# Patient Record
Sex: Male | Born: 1955 | Race: Black or African American | Hispanic: No | Marital: Single | Smoking: Current every day smoker
Health system: Southern US, Community
[De-identification: ages and names within clinical notes are randomized; demographics above are authoritative.]

## PROBLEM LIST (undated history)

## (undated) DIAGNOSIS — I10 Essential (primary) hypertension: Secondary | ICD-10-CM

## (undated) HISTORY — PX: NECK SURGERY: SHX720

---

## 2002-07-15 ENCOUNTER — Emergency Department (HOSPITAL_COMMUNITY): Admission: EM | Admit: 2002-07-15 | Discharge: 2002-07-15 | Payer: Self-pay | Admitting: *Deleted

## 2002-07-15 ENCOUNTER — Encounter: Payer: Self-pay | Admitting: *Deleted

## 2005-10-17 ENCOUNTER — Emergency Department (HOSPITAL_COMMUNITY): Admission: EM | Admit: 2005-10-17 | Discharge: 2005-10-17 | Payer: Self-pay | Admitting: Emergency Medicine

## 2008-01-06 ENCOUNTER — Emergency Department (HOSPITAL_COMMUNITY): Admission: EM | Admit: 2008-01-06 | Discharge: 2008-01-06 | Payer: Self-pay | Admitting: Emergency Medicine

## 2008-06-18 ENCOUNTER — Emergency Department (HOSPITAL_COMMUNITY): Admission: EM | Admit: 2008-06-18 | Discharge: 2008-06-18 | Payer: Self-pay | Admitting: Emergency Medicine

## 2008-12-14 ENCOUNTER — Emergency Department (HOSPITAL_COMMUNITY): Admission: EM | Admit: 2008-12-14 | Discharge: 2008-12-14 | Payer: Self-pay | Admitting: Emergency Medicine

## 2009-07-08 ENCOUNTER — Emergency Department (HOSPITAL_COMMUNITY): Admission: EM | Admit: 2009-07-08 | Discharge: 2009-07-08 | Payer: Self-pay | Admitting: Emergency Medicine

## 2009-12-26 ENCOUNTER — Emergency Department (HOSPITAL_COMMUNITY)
Admission: EM | Admit: 2009-12-26 | Discharge: 2009-12-26 | Payer: Self-pay | Source: Home / Self Care | Admitting: Emergency Medicine

## 2010-07-11 LAB — POCT CARDIAC MARKERS
CKMB, poc: 1.1 ng/mL (ref 1.0–8.0)
Myoglobin, poc: 33.4 ng/mL (ref 12–200)

## 2010-07-11 LAB — DIFFERENTIAL
Band Neutrophils: 0 % (ref 0–10)
Basophils Absolute: 0 10*3/uL (ref 0.0–0.1)
Basophils Relative: 0 % (ref 0–1)
Eosinophils Absolute: 0.3 10*3/uL (ref 0.0–0.7)
Eosinophils Relative: 5 % (ref 0–5)
Metamyelocytes Relative: 0 %
Monocytes Absolute: 0.1 10*3/uL (ref 0.1–1.0)
Monocytes Relative: 1 % — ABNORMAL LOW (ref 3–12)
Myelocytes: 0 %
nRBC: 0 /100 WBC

## 2010-07-11 LAB — COMPREHENSIVE METABOLIC PANEL
ALT: 27 U/L (ref 0–53)
AST: 51 U/L — ABNORMAL HIGH (ref 0–37)
Albumin: 3.8 g/dL (ref 3.5–5.2)
CO2: 26 mEq/L (ref 19–32)
Calcium: 8.8 mg/dL (ref 8.4–10.5)
Creatinine, Ser: 0.74 mg/dL (ref 0.4–1.5)
GFR calc Af Amer: >60 mL/min (ref >60)
GFR calc non Af Amer: >60 mL/min (ref >60)
Sodium: 141 mEq/L (ref 135–145)
Total Protein: 7 g/dL (ref 6.0–8.3)

## 2010-07-11 LAB — CBC
MCHC: 33.3 g/dL (ref 30.0–36.0)
MCV: 81.7 fL (ref 78.0–100.0)
Platelets: 217 10*3/uL (ref 150–400)
RBC: 4.61 MIL/uL (ref 4.22–5.81)
RDW: 13.9 % (ref 11.5–15.5)

## 2011-01-06 LAB — DIFFERENTIAL
Band Neutrophils: 0
Blasts: 0
Metamyelocytes Relative: 0
Promyelocytes Absolute: 0
nRBC: 0

## 2011-01-06 LAB — BASIC METABOLIC PANEL
BUN: 5 — ABNORMAL LOW
CO2: 24
Calcium: 9.1
Glucose, Bld: 104 — ABNORMAL HIGH
Sodium: 138

## 2011-01-06 LAB — POCT CARDIAC MARKERS
CKMB, poc: 2.4
Myoglobin, poc: 49.1
Troponin i, poc: <0.05

## 2011-01-06 LAB — CBC
HCT: 37.7 — ABNORMAL LOW
Hemoglobin: 12.5 — ABNORMAL LOW
MCHC: 33.1
Platelets: 248
RDW: 13.4

## 2012-01-01 ENCOUNTER — Encounter (HOSPITAL_COMMUNITY): Payer: Self-pay | Admitting: Emergency Medicine

## 2012-01-01 ENCOUNTER — Emergency Department (HOSPITAL_COMMUNITY)
Admission: EM | Admit: 2012-01-01 | Discharge: 2012-01-01 | Disposition: A | Discharge status: Home / Self Care | Payer: Self-pay | Attending: Emergency Medicine | Admitting: Emergency Medicine

## 2012-01-01 DIAGNOSIS — M545 Low back pain, unspecified: Secondary | ICD-10-CM | POA: Insufficient documentation

## 2012-01-01 DIAGNOSIS — M549 Dorsalgia, unspecified: Secondary | ICD-10-CM

## 2012-01-01 DIAGNOSIS — F17200 Nicotine dependence, unspecified, uncomplicated: Secondary | ICD-10-CM | POA: Insufficient documentation

## 2012-01-01 NOTE — ED Notes (Signed)
Patient states was lifting heavy objects yesterday; presents today with left hip pain. States feels like he pulled a muscle.

## 2012-01-01 NOTE — ED Provider Notes (Signed)
History     CSN: [id number]  Arrival date & time 01/01/12  4098   First MD Initiated Contact with Patient 01/01/12 [phone]      Chief Complaint  Patient presents with  . Hip Pain     Patient is a 56 y.o. male presenting with hip pain. The history is provided by the patient.  Hip Pain This is a new problem. The problem occurs constantly. The problem has not changed since onset.Pertinent negatives include no abdominal pain and no shortness of breath. The symptoms are aggravated by walking. The symptoms are relieved by rest. Treatments tried: ibuprofen. The treatment provided mild relief.  pt reports he was doing heavy lifting yesterday and felt like he "pulled a muscle" in his left hip.  No falls.  No difficulty walking.  No weakness in his legs.  No urinary incontinence reported.  He feels improved with rest and ibuprofen.   No abdominal pain is reported  PMH - none  History reviewed. No pertinent past surgical history.  No family history on file.  History  Substance Use Topics  . Smoking status: Current Every Day Smoker  . Smokeless tobacco: Not on file  . Alcohol Use: Yes     occ      Review of Systems  Respiratory: Negative for shortness of breath.   Gastrointestinal: Negative for abdominal pain.    Allergies  Review of patient's allergies indicates no known allergies.  Home Medications  No current outpatient prescriptions on file.  BP 160/80  Pulse 59  Temp 97.9 F (36.6 C) (Oral)  Resp 18  Ht 5\' 7"  (1.702 m)  Wt 150 lb (68.04 kg)  BMI 23.49 kg/m2  SpO2 99%  Physical Exam CONSTITUTIONAL: Well developed/well nourished HEAD AND FACE: Normocephalic/atraumatic EYES: EOMI/PERRL ENMT: Mucous membranes moist NECK: supple no meningeal signs SPINE:entire spine nontender Lumbar paraspinal tenderness noted.  No bruising/crepitance/stepoffs noted to spine CV: S1/S2 noted, no murmurs/rubs/gallops noted LUNGS: Lungs are clear to auscultation bilaterally, no  apparent distress ABDOMEN: soft, nontender, no rebound or guarding NEURO: Pt is awake/alert, moves all extremitiesx4.  Gait normal.    equal distal motor: hip flexion/knee flexion/extension, ankle dorsi/plantar flexion, no apparent sensory deficit in any dermatome. Pt is able to ambulate unassisted. EXTREMITIES:  full ROM.  No tenderness with ROM of left hip SKIN: warm, color normal PSYCH: no abnormalities of mood noted  ED Course  Procedures    1. Back pain       MDM  Nursing notes including past medical history and social history reviewed and considered in documentation  Pt advised to limit heavy lifting for one week, continue ibuprofen and return for worsened pain or any new weakness in his legs over the next 24-48 hours        Joya Gaskins, MD 01/01/12 [phone]

## 2012-01-27 ENCOUNTER — Other Ambulatory Visit: Payer: Self-pay | Admitting: *Deleted

## 2014-02-14 ENCOUNTER — Encounter (HOSPITAL_COMMUNITY): Payer: Self-pay | Admitting: Cardiology

## 2014-02-14 ENCOUNTER — Emergency Department (HOSPITAL_COMMUNITY)
Admission: EM | Admit: 2014-02-14 | Discharge: 2014-02-14 | Disposition: A | Discharge status: Home / Self Care | Payer: Self-pay | Attending: Emergency Medicine | Admitting: Emergency Medicine

## 2014-02-14 ENCOUNTER — Emergency Department (HOSPITAL_COMMUNITY): Payer: Self-pay

## 2014-02-14 DIAGNOSIS — Z720 Tobacco use: Secondary | ICD-10-CM | POA: Insufficient documentation

## 2014-02-14 DIAGNOSIS — I10 Essential (primary) hypertension: Secondary | ICD-10-CM | POA: Insufficient documentation

## 2014-02-14 DIAGNOSIS — R410 Disorientation, unspecified: Secondary | ICD-10-CM | POA: Insufficient documentation

## 2014-02-14 HISTORY — DX: Essential (primary) hypertension: I10

## 2014-02-14 LAB — RAPID URINE DRUG SCREEN, HOSP PERFORMED
AMPHETAMINES: NOT DETECTED
Barbiturates: NOT DETECTED
Benzodiazepines: POSITIVE — AB
Cocaine: NOT DETECTED
OPIATES: NOT DETECTED
Tetrahydrocannabinol: NOT DETECTED

## 2014-02-14 LAB — APTT: aPTT: 24 seconds (ref 24–37)

## 2014-02-14 LAB — BASIC METABOLIC PANEL
ANION GAP: 13 (ref 5–15)
BUN: 13 mg/dL (ref 6–23)
CALCIUM: 10.4 mg/dL (ref 8.4–10.5)
CO2: 25 mEq/L (ref 19–32)
CREATININE: 0.86 mg/dL (ref 0.50–1.35)
Chloride: 100 mEq/L (ref 96–112)
GFR calc non Af Amer: >90 mL/min (ref >90)
Glucose, Bld: 101 mg/dL — ABNORMAL HIGH (ref 70–99)
Potassium: 3.9 mEq/L (ref 3.7–5.3)
Sodium: 138 mEq/L (ref 137–147)

## 2014-02-14 LAB — CBC WITH DIFFERENTIAL/PLATELET
BASOS ABS: 0 10*3/uL (ref 0.0–0.1)
BASOS PCT: 0 % (ref 0–1)
EOS PCT: 1 % (ref 0–5)
Eosinophils Absolute: 0 10*3/uL (ref 0.0–0.7)
HEMATOCRIT: 37.3 % — AB (ref 39.0–52.0)
HEMOGLOBIN: 12.4 g/dL — AB (ref 13.0–17.0)
Lymphocytes Relative: 17 % (ref 12–46)
Lymphs Abs: 1.1 10*3/uL (ref 0.7–4.0)
MCH: 26.8 pg (ref 26.0–34.0)
MCHC: 33.2 g/dL (ref 30.0–36.0)
MCV: 80.6 fL (ref 78.0–100.0)
MONO ABS: 0.9 10*3/uL (ref 0.1–1.0)
MONOS PCT: 13 % — AB (ref 3–12)
Neutro Abs: 4.4 10*3/uL (ref 1.7–7.7)
Neutrophils Relative %: 69 % (ref 43–77)
Platelets: 160 10*3/uL (ref 150–400)
RBC: 4.63 MIL/uL (ref 4.22–5.81)
RDW: 14.1 % (ref 11.5–15.5)
WBC: 6.4 10*3/uL (ref 4.0–10.5)

## 2014-02-14 LAB — URINALYSIS, ROUTINE W REFLEX MICROSCOPIC
BILIRUBIN URINE: NEGATIVE
GLUCOSE, UA: NEGATIVE mg/dL
Hgb urine dipstick: NEGATIVE
KETONES UR: NEGATIVE mg/dL
LEUKOCYTES UA: NEGATIVE
NITRITE: NEGATIVE
PH: 6 (ref 5.0–8.0)
Protein, ur: NEGATIVE mg/dL
SPECIFIC GRAVITY, URINE: 1.02 (ref 1.005–1.030)
Urobilinogen, UA: 1 mg/dL (ref 0.0–1.0)

## 2014-02-14 LAB — PROTIME-INR
INR: 1.02 (ref 0.00–1.49)
Prothrombin Time: 13.5 seconds (ref 11.6–15.2)

## 2014-02-14 LAB — ETHANOL

## 2014-02-14 LAB — AMMONIA: Ammonia: 39 umol/L (ref 11–60)

## 2014-02-14 MED ORDER — SODIUM CHLORIDE 0.9 % IV SOLN
1000.0000 mL | INTRAVENOUS | Status: DC
  Administered 2014-02-14: 1000 mL via INTRAVENOUS

## 2014-02-14 NOTE — ED Notes (Addendum)
[facility]  Per [facility] jail,  Pt has been hallucinating, aggitated  and has been confused.  Has been at the jail since 10/7.  Pt is a heavy, daily drinker. Staff nurse gave Librium,  norvasc , hctz and Mag ox at the jail this morning.   Pt states he is here for his blood pressure being high.  States he only drinks every now and then.

## 2014-02-14 NOTE — Discharge Instructions (Signed)
Confusion Confusion is the inability to think with your usual speed or clarity. Confusion may come on quickly or slowly over time. How quickly the confusion comes on depends on the cause. Confusion can be due to any number of causes. CAUSES   Concussion, head injury, or head trauma.  Seizures.  Stroke.  Fever.  Brain tumor.  Age related decreased brain function (dementia).  Heightened emotional states like rage or terror.  Mental illness in which the person loses the ability to determine what is real and what is not (hallucinations).  Infections such as a urinary tract infection (UTI).  Toxic effects from alcohol, drugs, or prescription medicines.  Dehydration and an imbalance of salts in the body (electrolytes).  Lack of sleep.  Low blood sugar (diabetes).  Low levels of oxygen from conditions such as chronic lung disorders.  Drug interactions or other medicine side effects.  Nutritional deficiencies, especially niacin, thiamine, vitamin C, or vitamin B.  Sudden drop in body temperature (hypothermia).  Change in routine, such as when traveling or hospitalized. SIGNS AND SYMPTOMS  People often describe their thinking as cloudy or unclear when they are confused. Confusion can also include feeling disoriented. That means you are unaware of where or who you are. You may also not know what the date or time is. If confused, you may also have difficulty paying attention, remembering, and making decisions. Some people also act aggressively when they are confused.  DIAGNOSIS  The medical evaluation of confusion may include:  Blood and urine tests.  X-rays.  Brain and nervous system tests.  Analyzing your brain waves (electroencephalogram or EEG).  Magnetic resonance imaging (MRI) of your head.  Computed tomography (CT) scan of your head.  Mental status tests in which your health care provider may ask many questions. Some of these questions may seem silly or strange,  but they are a very important test to help diagnose and treat confusion. TREATMENT  An admission to the hospital may not be needed, but a person with confusion should not be left alone. Stay with a family member or friend until the confusion clears. Avoid alcohol, pain relievers, or sedative drugs until you have fully recovered. Do not drive until directed by your health care provider. HOME CARE INSTRUCTIONS  What family and friends can do:  To find out if someone is confused, ask the person to state his or her name, age, and the date. If the person is unsure or answers incorrectly, he or she is confused.  Always introduce yourself, no matter how well the person knows you.  Often remind the person of his or her location.  Place a calendar and clock near the confused person.  Help the person with his or her medicines. You may want to use a pill box, an alarm as a reminder, or give the person each dose as prescribed.  Talk about current events and plans for the day.  Try to keep the environment calm, quiet, and peaceful.  Make sure the person keeps follow-up visits with his or her health care provider. PREVENTION  Ways to prevent confusion:  Avoid alcohol.  Eat a balanced diet.  Get enough sleep.  Take medicine only as directed by your health care provider.  Do not become isolated. Spend time with other people and make plans for your days.  Keep careful watch on your blood sugar levels if you are diabetic. SEEK IMMEDIATE MEDICAL CARE IF:   You develop severe headaches, repeated vomiting, seizures, blackouts, or   slurred speech.  There is increasing confusion, weakness, numbness, restlessness, or personality changes.  You develop a loss of balance, have marked dizziness, feel uncoordinated, or fall.  You have delusions, hallucinations, or develop severe anxiety.  Your family members think you need to be rechecked. Document Released: 04/30/2004 Document Revised: 08/07/2013  Document Reviewed: 04/28/2013 [employer] Patient Information 2015 [employer]. This information is not intended to replace advice given to you by your health care provider. Make sure you discuss any questions you have with your health care provider.  
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
## 2015-11-10 ENCOUNTER — Emergency Department (HOSPITAL_COMMUNITY)
Admission: EM | Admit: 2015-11-10 | Discharge: 2015-11-10 | Disposition: A | Discharge status: Home / Self Care | Payer: No Typology Code available for payment source | Attending: Emergency Medicine | Admitting: Emergency Medicine

## 2015-11-10 ENCOUNTER — Encounter (HOSPITAL_COMMUNITY): Payer: Self-pay

## 2015-11-10 ENCOUNTER — Emergency Department (HOSPITAL_COMMUNITY): Payer: No Typology Code available for payment source

## 2015-11-10 DIAGNOSIS — S39012A Strain of muscle, fascia and tendon of lower back, initial encounter: Secondary | ICD-10-CM | POA: Diagnosis not present

## 2015-11-10 DIAGNOSIS — R0781 Pleurodynia: Secondary | ICD-10-CM | POA: Diagnosis not present

## 2015-11-10 DIAGNOSIS — Y939 Activity, unspecified: Secondary | ICD-10-CM | POA: Diagnosis not present

## 2015-11-10 DIAGNOSIS — F17200 Nicotine dependence, unspecified, uncomplicated: Secondary | ICD-10-CM | POA: Diagnosis not present

## 2015-11-10 DIAGNOSIS — R51 Headache: Secondary | ICD-10-CM | POA: Diagnosis not present

## 2015-11-10 DIAGNOSIS — Y92410 Unspecified street and highway as the place of occurrence of the external cause: Secondary | ICD-10-CM | POA: Insufficient documentation

## 2015-11-10 DIAGNOSIS — Y999 Unspecified external cause status: Secondary | ICD-10-CM | POA: Diagnosis not present

## 2015-11-10 DIAGNOSIS — S199XXA Unspecified injury of neck, initial encounter: Secondary | ICD-10-CM | POA: Diagnosis present

## 2015-11-10 DIAGNOSIS — S161XXA Strain of muscle, fascia and tendon at neck level, initial encounter: Secondary | ICD-10-CM | POA: Diagnosis not present

## 2015-11-10 DIAGNOSIS — I10 Essential (primary) hypertension: Secondary | ICD-10-CM | POA: Diagnosis not present

## 2015-11-10 MED ORDER — CYCLOBENZAPRINE HCL 10 MG PO TABS: 10.0000 mg | ORAL_TABLET | Freq: Three times a day (TID) | ORAL | 0 refills | Status: DC | PRN

## 2015-11-10 MED ORDER — IBUPROFEN 800 MG PO TABS: 800.0000 mg | ORAL_TABLET | Freq: Three times a day (TID) | ORAL | 0 refills | Status: DC
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
## 2015-11-10 NOTE — Discharge Instructions (Signed)
Apply ice packs on/off to your back and neck.  Follow-up with the clinic listed or return to ER

## 2015-11-10 NOTE — ED Notes (Signed)
Patient transported to [facility].

## 2019-02-09 ENCOUNTER — Emergency Department (HOSPITAL_COMMUNITY)
Admission: EM | Admit: 2019-02-09 | Discharge: 2019-02-09 | Disposition: A | Discharge status: Home / Self Care | Payer: Self-pay | Attending: Emergency Medicine | Admitting: Emergency Medicine

## 2019-02-09 ENCOUNTER — Encounter (HOSPITAL_COMMUNITY): Payer: Self-pay | Admitting: Emergency Medicine

## 2019-02-09 ENCOUNTER — Other Ambulatory Visit: Payer: Self-pay

## 2019-02-09 DIAGNOSIS — R0789 Other chest pain: Secondary | ICD-10-CM | POA: Insufficient documentation

## 2019-02-09 DIAGNOSIS — M25562 Pain in left knee: Secondary | ICD-10-CM | POA: Insufficient documentation

## 2019-02-09 DIAGNOSIS — Z5321 Procedure and treatment not carried out due to patient leaving prior to being seen by health care provider: Secondary | ICD-10-CM | POA: Insufficient documentation

## 2019-02-09 NOTE — ED Triage Notes (Signed)
Pt states that his knees are hurting him he denies injury he states that he fell hurting his ribs on the right side about 2 weeks ago

## 2019-05-27 ENCOUNTER — Encounter (HOSPITAL_COMMUNITY): Payer: Self-pay | Admitting: Emergency Medicine

## 2019-05-27 ENCOUNTER — Other Ambulatory Visit: Payer: Self-pay

## 2019-05-27 ENCOUNTER — Emergency Department (HOSPITAL_COMMUNITY)
Admission: EM | Admit: 2019-05-27 | Discharge: 2019-05-27 | Disposition: A | Discharge status: Home / Self Care | Payer: Medicaid Other | Attending: Emergency Medicine | Admitting: Emergency Medicine

## 2019-05-27 ENCOUNTER — Emergency Department (HOSPITAL_COMMUNITY): Payer: Medicaid Other

## 2019-05-27 DIAGNOSIS — R0789 Other chest pain: Secondary | ICD-10-CM | POA: Insufficient documentation

## 2019-05-27 DIAGNOSIS — I10 Essential (primary) hypertension: Secondary | ICD-10-CM | POA: Insufficient documentation

## 2019-05-27 DIAGNOSIS — F17210 Nicotine dependence, cigarettes, uncomplicated: Secondary | ICD-10-CM | POA: Diagnosis not present

## 2019-05-27 LAB — BASIC METABOLIC PANEL
Anion gap: 11 (ref 5–15)
BUN: 8 mg/dL (ref 8–23)
CO2: 23 mmol/L (ref 22–32)
Calcium: 8.8 mg/dL — ABNORMAL LOW (ref 8.9–10.3)
Chloride: 105 mmol/L (ref 98–111)
Creatinine, Ser: 0.75 mg/dL (ref 0.61–1.24)
GFR calc Af Amer: >60 mL/min (ref >60)
GFR calc non Af Amer: >60 mL/min (ref >60)
Glucose, Bld: 90 mg/dL (ref 70–99)
Potassium: 3.4 mmol/L — ABNORMAL LOW (ref 3.5–5.1)
Sodium: 139 mmol/L (ref 135–145)

## 2019-05-27 LAB — TROPONIN I (HIGH SENSITIVITY)
Troponin I (High Sensitivity): 30 ng/L — ABNORMAL HIGH (ref <18)
Troponin I (High Sensitivity): 26 ng/L — ABNORMAL HIGH (ref <18)

## 2019-05-27 LAB — CBC
HCT: 40.4 % (ref 39.0–52.0)
Hemoglobin: 12.6 g/dL — ABNORMAL LOW (ref 13.0–17.0)
MCH: 25.7 pg — ABNORMAL LOW (ref 26.0–34.0)
MCHC: 31.2 g/dL (ref 30.0–36.0)
MCV: 82.4 fL (ref 80.0–100.0)
Platelets: 344 10*3/uL (ref 150–400)
RBC: 4.9 MIL/uL (ref 4.22–5.81)
RDW: 15.1 % (ref 11.5–15.5)
WBC: 7.5 10*3/uL (ref 4.0–10.5)
nRBC: 0 % (ref 0.0–0.2)

## 2019-05-27 MED ORDER — TRAMADOL HCL 50 MG PO TABS: 50.0000 mg | ORAL_TABLET | Freq: Four times a day (QID) | ORAL | 0 refills | Status: DC | PRN

## 2019-05-27 MED ORDER — OXYCODONE-ACETAMINOPHEN 5-325 MG PO TABS
1.0000 | ORAL_TABLET | Freq: Once | ORAL | Status: AC
  Administered 2019-05-27: 13:00:00 1 via ORAL
  Filled 2019-05-27: qty 1

## 2019-05-27 NOTE — ED Notes (Signed)
Pt educated about his elevated bp. Advised to find PCP and resources given to aid in this. Verbalized understanding.

## 2019-05-27 NOTE — ED Provider Notes (Signed)
[facility] EMERGENCY DEPARTMENT Provider Note   CSN: [id number] Arrival date & time: 05/27/19  1211     History Chief Complaint  Patient presents with  . Chest Pain    Alec Watson is a 64 y.o. male.  Patient complains of left-sided chest pain.  The pain is worse with palpation  The history is provided by the patient. No language interpreter was used.  Chest Pain Pain location:  L chest Pain quality: aching   Pain radiates to:  Does not radiate Pain severity:  Moderate Onset quality:  Sudden Timing:  Constant Progression:  Worsening Associated symptoms: no abdominal pain, no back pain, no cough, no fatigue and no headache        Past Medical History:  Diagnosis Date  . Hypertension     There are no problems to display for this patient.   Past Surgical History:  Procedure Laterality Date  . NECK SURGERY         No family history on file.  Social History   Tobacco Use  . Smoking status: Current Every Day Smoker    Packs/day: 0.50    Types: Cigarettes  . Smokeless tobacco: Never Used  Substance Use Topics  . Alcohol use: Yes    Comment: per pt every now and then.   . Drug use: No    Home Medications Prior to Admission medications   Medication Sig Start Date End Date Taking? Authorizing Provider  ibuprofen (ADVIL) 200 MG tablet Take 400 mg by mouth every 6 (six) hours as needed.   Yes [provider]  traMADol (ULTRAM) 50 MG tablet Take 1 tablet (50 mg total) by mouth every 6 (six) hours as needed. 05/27/19   Milton Ferguson, MD    Allergies    Patient has no known allergies.  Review of Systems   Review of Systems  Constitutional: Negative for appetite change and fatigue.  HENT: Negative for congestion, ear discharge and sinus pressure.   Eyes: Negative for discharge.  Respiratory: Negative for cough.   Cardiovascular: Positive for chest pain.  Gastrointestinal: Negative for abdominal pain and diarrhea.  Genitourinary: Negative  for frequency and hematuria.  Musculoskeletal: Negative for back pain.  Skin: Negative for rash.  Neurological: Negative for seizures and headaches.  Psychiatric/Behavioral: Negative for hallucinations.    Physical Exam Updated Vital Signs BP (!) 161/94   Pulse 65   Temp 98.1 F (36.7 C) (Oral)   Resp 13   Ht 5\' 7"  (1.702 m)   Wt 67.1 kg   SpO2 95%   BMI 23.18 kg/m   Physical Exam Vitals and nursing note reviewed.  Constitutional:      Appearance: He is well-developed.  HENT:     Head: Normocephalic.     Nose: Nose normal.  Eyes:     General: No scleral icterus.    Conjunctiva/sclera: Conjunctivae normal.  Neck:     Thyroid: No thyromegaly.  Cardiovascular:     Rate and Rhythm: Normal rate and regular rhythm.     Heart sounds: No murmur. No friction rub. No gallop.   Pulmonary:     Breath sounds: No stridor. No wheezing or rales.     Comments: Tender left sciatic chest Chest:     Chest wall: No tenderness.  Abdominal:     General: There is no distension.     Tenderness: There is no abdominal tenderness. There is no rebound.  Musculoskeletal:        General: Normal range  of motion.     Cervical back: Neck supple.  Lymphadenopathy:     Cervical: No cervical adenopathy.  Skin:    Findings: No erythema or rash.  Neurological:     Mental Status: He is alert and oriented to person, place, and time.     Motor: No abnormal muscle tone.     Coordination: Coordination normal.  Psychiatric:        Behavior: Behavior normal.     ED Results / Procedures / Treatments   Labs (all labs ordered are listed, but only abnormal results are displayed) Labs Reviewed  BASIC METABOLIC PANEL - Abnormal; Notable for the following components:      Result Value   Potassium 3.4 (*)    Calcium 8.8 (*)    All other components within normal limits  CBC - Abnormal; Notable for the following components:   Hemoglobin 12.6 (*)    MCH 25.7 (*)    All other components within normal  limits  TROPONIN I (HIGH SENSITIVITY) - Abnormal; Notable for the following components:   Troponin I (High Sensitivity) 30 (*)    All other components within normal limits  TROPONIN I (HIGH SENSITIVITY) - Abnormal; Notable for the following components:   Troponin I (High Sensitivity) 26 (*)    All other components within normal limits    EKG EKG Interpretation  Date/Time:  Saturday May 27 2019 12:29:49 EST Ventricular Rate:  82 PR Interval:    QRS Duration: 74 QT Interval:  397 QTC Calculation: 464 R Axis:   68 Text Interpretation: Sinus rhythm Probable left atrial enlargement Probable left ventricular hypertrophy Baseline wander in lead(s) II III aVF Confirmed by Bethann Berkshire [phone]) on 05/27/2019 12:48:11 PM   Radiology DG Chest 2 View  Result Date: 05/27/2019 CLINICAL DATA:  Chest pain EXAM: CHEST - 2 VIEW COMPARISON:  Chest radiograph dated 11/10/2015 FINDINGS: The heart size is within normal limits. Vascular calcifications are seen in the aortic arch. Both lungs are clear. The visualized skeletal structures are unremarkable. IMPRESSION: No active cardiopulmonary disease. Electronically Signed   By: Romona Curls M.D.   On: 05/27/2019 13:03    Procedures Procedures (including critical care time)  Medications Ordered in ED Medications  oxyCODONE-acetaminophen (PERCOCET/ROXICET) 5-325 MG per tablet 1 tablet (1 tablet Oral Given 05/27/19 1319)    ED Course  I have reviewed the triage vital signs and the nursing notes.  Pertinent labs & imaging results that were available during my care of the patient were reviewed by me and considered in my medical decision making (see chart for details).    MDM Rules/Calculators/A&P                      Labs and x-rays unremarkable.  Suspect chest wall discomfort.  Patient will be discharged home for follow-up with PCP Final Clinical Impression(s) / ED Diagnoses Final diagnoses:  Atypical chest pain    Rx / DC Orders ED  Discharge Orders         Ordered    traMADol (ULTRAM) 50 MG tablet  Every 6 hours PRN     05/27/19 1520           Bethann Berkshire, MD 05/29/19 1546

## 2019-05-27 NOTE — ED Triage Notes (Addendum)
Patient c/o left side chest pain, non-radiating. Per patient started approx 4 hours ago with shortness of breath and dizziness. Denies any cardiac hx. Increases pain with movement and palpitation.

## 2019-05-27 NOTE — Discharge Instructions (Signed)
Follow up with your family md in 1-2 weeks for recheck

## 2019-06-16 ENCOUNTER — Encounter (HOSPITAL_COMMUNITY): Payer: Self-pay

## 2019-06-16 ENCOUNTER — Other Ambulatory Visit: Payer: Self-pay

## 2019-06-16 ENCOUNTER — Emergency Department (HOSPITAL_COMMUNITY): Payer: Medicaid Other

## 2019-06-16 ENCOUNTER — Emergency Department (HOSPITAL_COMMUNITY)
Admission: EM | Admit: 2019-06-16 | Discharge: 2019-06-16 | Disposition: A | Discharge status: Home / Self Care | Payer: Medicaid Other | Attending: Emergency Medicine | Admitting: Emergency Medicine

## 2019-06-16 DIAGNOSIS — F17210 Nicotine dependence, cigarettes, uncomplicated: Secondary | ICD-10-CM | POA: Insufficient documentation

## 2019-06-16 DIAGNOSIS — I10 Essential (primary) hypertension: Secondary | ICD-10-CM | POA: Insufficient documentation

## 2019-06-16 DIAGNOSIS — R0789 Other chest pain: Secondary | ICD-10-CM | POA: Insufficient documentation

## 2019-06-16 LAB — CBC WITH DIFFERENTIAL/PLATELET
Abs Immature Granulocytes: 0.01 10*3/uL (ref 0.00–0.07)
Basophils Absolute: 0 10*3/uL (ref 0.0–0.1)
Basophils Relative: 0 %
Eosinophils Absolute: 0.1 10*3/uL (ref 0.0–0.5)
Eosinophils Relative: 2 %
HCT: 35.7 % — ABNORMAL LOW (ref 39.0–52.0)
Hemoglobin: 11.4 g/dL — ABNORMAL LOW (ref 13.0–17.0)
Immature Granulocytes: 0 %
Lymphocytes Relative: 25 %
Lymphs Abs: 1.3 10*3/uL (ref 0.7–4.0)
MCH: 26.3 pg (ref 26.0–34.0)
MCHC: 31.9 g/dL (ref 30.0–36.0)
MCV: 82.4 fL (ref 80.0–100.0)
Monocytes Absolute: 0.3 10*3/uL (ref 0.1–1.0)
Monocytes Relative: 7 %
Neutro Abs: 3.5 10*3/uL (ref 1.7–7.7)
Neutrophils Relative %: 66 %
Platelets: 296 10*3/uL (ref 150–400)
RBC: 4.33 MIL/uL (ref 4.22–5.81)
RDW: 14.5 % (ref 11.5–15.5)
WBC: 5.3 10*3/uL (ref 4.0–10.5)
nRBC: 0 % (ref 0.0–0.2)

## 2019-06-16 LAB — BASIC METABOLIC PANEL
Anion gap: 8 (ref 5–15)
BUN: 6 mg/dL — ABNORMAL LOW (ref 8–23)
CO2: 24 mmol/L (ref 22–32)
Calcium: 8.6 mg/dL — ABNORMAL LOW (ref 8.9–10.3)
Chloride: 109 mmol/L (ref 98–111)
Creatinine, Ser: 0.71 mg/dL (ref 0.61–1.24)
GFR calc Af Amer: >60 mL/min (ref >60)
GFR calc non Af Amer: >60 mL/min (ref >60)
Glucose, Bld: 91 mg/dL (ref 70–99)
Potassium: 3.8 mmol/L (ref 3.5–5.1)
Sodium: 141 mmol/L (ref 135–145)

## 2019-06-16 LAB — TROPONIN I (HIGH SENSITIVITY)
Troponin I (High Sensitivity): 16 ng/L (ref <18)
Troponin I (High Sensitivity): 14 ng/L (ref <18)

## 2019-06-16 MED ORDER — LISINOPRIL 10 MG PO TABS
10.0000 mg | ORAL_TABLET | Freq: Once | ORAL | Status: AC
  Administered 2019-06-16: 10 mg via ORAL
  Filled 2019-06-16: qty 1

## 2019-06-16 MED ORDER — HYDRALAZINE HCL 20 MG/ML IJ SOLN
5.0000 mg | Freq: Once | INTRAMUSCULAR | Status: AC
  Administered 2019-06-16: 08:00:00 5 mg via INTRAVENOUS
  Filled 2019-06-16: qty 1

## 2019-06-16 MED ORDER — LISINOPRIL 10 MG PO TABS: 10.0000 mg | ORAL_TABLET | Freq: Every day | ORAL | 0 refills | Status: DC

## 2019-06-16 NOTE — ED Provider Notes (Signed)
[facility] EMERGENCY DEPARTMENT Provider Note   CSN: [id number] Arrival date & time: 06/16/19  2423     History Chief Complaint  Patient presents with  . Chest Pain    Alec Watson is a 64 y.o. male presenting with persistent left chest pain since his evaluation here 2 weeks ago.  He describes a localized soreness in his left ribcage which is triggered by breathing and palpation.  He denies sob, palpitations, cough, fevers, chills, n/v or diaphoresis.  He describes tripping and falling on his chest a few days before the pain started.  Denies peripheral edema, no leg pain.  Also, bp extremely elevated today.  He denies h/o HTN, but chart actually does reflect this diagnosis.  He reports frequent headaches, uses tylenol and ibuprofen frequently for headaches, mild headache now, has had no medications for this today.   The history is provided by the patient.       Past Medical History:  Diagnosis Date  . Hypertension     There are no problems to display for this patient.   Past Surgical History:  Procedure Laterality Date  . NECK SURGERY         No family history on file.  Social History   Tobacco Use  . Smoking status: Current Every Day Smoker    Packs/day: 0.50    Types: Cigarettes  . Smokeless tobacco: Never Used  Substance Use Topics  . Alcohol use: Yes    Comment: per pt every now and then.   . Drug use: No    Home Medications Prior to Admission medications   Medication Sig Start Date End Date Taking? Authorizing Provider  ibuprofen (ADVIL) 200 MG tablet Take 400 mg by mouth every 6 (six) hours as needed.    [provider]  lisinopril (ZESTRIL) 10 MG tablet Take 1 tablet (10 mg total) by mouth daily. 06/16/19   Burgess Amor, PA-C  traMADol (ULTRAM) 50 MG tablet Take 1 tablet (50 mg total) by mouth every 6 (six) hours as needed. 05/27/19   Bethann Berkshire, MD    Allergies    Patient has no known allergies.  Review of Systems   Review of  Systems  Constitutional: Negative for chills and fever.  HENT: Negative for congestion and sore throat.   Eyes: Negative.   Respiratory: Negative for cough, chest tightness, shortness of breath and wheezing.   Cardiovascular: Positive for chest pain. Negative for palpitations and leg swelling.  Gastrointestinal: Negative for abdominal pain, nausea and vomiting.  Genitourinary: Negative.   Musculoskeletal: Negative for arthralgias, joint swelling and neck pain.  Skin: Negative.  Negative for rash and wound.  Neurological: Positive for headaches. Negative for dizziness, weakness, light-headedness and numbness.  Psychiatric/Behavioral: Negative.     Physical Exam Updated Vital Signs BP (!) 192/107   Pulse 99   Temp 97.7 F (36.5 C) (Oral)   Resp 17   Ht 5\' 7"  (1.702 m)   Wt 71.7 kg   SpO2 100%   BMI 24.75 kg/m   Physical Exam Vitals and nursing note reviewed.  Constitutional:      Appearance: He is well-developed.  HENT:     Head: Normocephalic and atraumatic.  Eyes:     Conjunctiva/sclera: Conjunctivae normal.  Cardiovascular:     Rate and Rhythm: Normal rate and regular rhythm.     Heart sounds: Normal heart sounds. Heart sounds not distant. No murmur.  Pulmonary:     Effort: Pulmonary effort is normal.  Breath sounds: Normal breath sounds. No wheezing.  Chest:     Chest wall: Tenderness present.     Comments: Point tender left anterior mid ribs. No edema, no crepitus, no hematoma.  Abdominal:     General: Bowel sounds are normal.     Palpations: Abdomen is soft.     Tenderness: There is no abdominal tenderness.  Musculoskeletal:        General: Normal range of motion.     Cervical back: Normal range of motion.     Right lower leg: No tenderness. No edema.     Left lower leg: No tenderness. No edema.  Skin:    General: Skin is warm and dry.  Neurological:     Mental Status: He is alert.     ED Results / Procedures / Treatments   Labs (all labs ordered  are listed, but only abnormal results are displayed) Labs Reviewed  CBC WITH DIFFERENTIAL/PLATELET - Abnormal; Notable for the following components:      Result Value   Hemoglobin 11.4 (*)    HCT 35.7 (*)    All other components within normal limits  BASIC METABOLIC PANEL - Abnormal; Notable for the following components:   BUN 6 (*)    Calcium 8.6 (*)    All other components within normal limits  TROPONIN I (HIGH SENSITIVITY)  TROPONIN I (HIGH SENSITIVITY)    EKG EKG Interpretation  Date/Time:  Friday June 16 2019 07:45:25 EST Ventricular Rate:  59 PR Interval:    QRS Duration: 81 QT Interval:  486 QTC Calculation: 482 R Axis:   62 Text Interpretation: Sinus rhythm Biatrial enlargement Left ventricular hypertrophy Borderline prolonged QT interval Confirmed by Bethann Berkshire [id number]) on 06/16/2019 12:45:37 PM   Radiology DG Ribs Unilateral W/Chest Left  Result Date: 06/16/2019 CLINICAL DATA:  Left chest pain after a fall 2 weeks ago. Hypertension. Smoker. EXAM: LEFT RIBS AND CHEST - 3+ VIEW COMPARISON:  05/27/2019 chest radiograph FINDINGS: Frontal view of the chest and three views of left-sided ribs. Frontal view of the chest demonstrates midline trachea. Borderline cardiomegaly. Tortuous thoracic aorta. Atherosclerosis in the transverse aorta. No pleural effusion or pneumothorax. An EKG lead projects over the right upper lobe. Moderate pulmonary interstitial thickening. This is slightly greater on the left than right. Dedicated rib radiographs demonstrate a radiographic marker projecting over the ninth and tenth posterior left ribs. No underlying displaced rib fracture. IMPRESSION: 1. No evidence of displaced rib fracture, pleural fluid, or pneumothorax. 2. Moderate pulmonary interstitial thickening, likely related to smoking/chronic bronchitis. This is slightly asymmetric, worse on the left. If left-sided pneumonia or aspiration is a concern, consider radiographic follow-up in 5-7  days, including PA and lateral films. Electronically Signed   By: Jeronimo Greaves M.D.   On: 06/16/2019 08:51    Procedures Procedures (including critical care time)  Medications Ordered in ED Medications  hydrALAZINE (APRESOLINE) injection 5 mg (5 mg Intravenous Given 06/16/19 0823)  lisinopril (ZESTRIL) tablet 10 mg (10 mg Oral Given 06/16/19 1226)    ED Course  I have reviewed the triage vital signs and the nursing notes.  Pertinent labs & imaging results that were available during my care of the patient were reviewed by me and considered in my medical decision making (see chart for details).    MDM Rules/Calculators/A&P HEAR Score: 3                    Pt with reproducible left chest wall pain, no  sob, no fevers, ekg, cxr, labs including delta troponins are stable.  He was very hypertensive here, not on bp meds reporting never has but has h/o HTN in his chart.  He was started on lisinopril here, also given IV dose of hydralazine which he did respond to.  No sign of end organ damage, creatinine normal.  Advised continued tylenol and heat for chest wall pain.  Referrals given for pcp and to [facility] for repeat bp within on e week.  Final Clinical Impression(s) / ED Diagnoses Final diagnoses:  Chest wall pain  Hypertension, unspecified type    Rx / DC Orders ED Discharge Orders         Ordered    lisinopril (ZESTRIL) 10 MG tablet  Daily     06/16/19 1140           Evalee Jefferson, Hershal Coria 06/16/19 1251    Milton Ferguson, MD 06/16/19 1510

## 2019-06-16 NOTE — Discharge Instructions (Addendum)
Your lab tests, x-rays and EKG are reassuring today.  You do not have a rib fracture and there is no sign of heart or lung problems as the source of your pain.  I suspect you have a cartilage or rib strain which can last for a while.  You may apply heat to the site such as using a heating pad.  I would continue using Tylenol additionally.  I do not recommend ibuprofen or Aleve as these medications can increase your blood pressure.  I am prescribing you a blood pressure medication which you should take daily.  See the referrals given above for follow-up care and to establish care with a primary MD.

## 2019-06-16 NOTE — ED Triage Notes (Signed)
Pt was here 2 weeks ago for chest pain. Had a full workup and was discharged with a prescription for Tramadol. States he is still having CP with inhalation.

## 2019-06-25 ENCOUNTER — Emergency Department (HOSPITAL_COMMUNITY): Payer: Medicaid Other

## 2019-06-25 ENCOUNTER — Emergency Department (HOSPITAL_COMMUNITY)
Admission: EM | Admit: 2019-06-25 | Discharge: 2019-06-26 | Disposition: A | Discharge status: Home / Self Care | Payer: Medicaid Other | Attending: Emergency Medicine | Admitting: Emergency Medicine

## 2019-06-25 ENCOUNTER — Encounter (HOSPITAL_COMMUNITY): Payer: Self-pay | Admitting: Emergency Medicine

## 2019-06-25 ENCOUNTER — Other Ambulatory Visit: Payer: Self-pay

## 2019-06-25 DIAGNOSIS — F17210 Nicotine dependence, cigarettes, uncomplicated: Secondary | ICD-10-CM | POA: Diagnosis not present

## 2019-06-25 DIAGNOSIS — Z79899 Other long term (current) drug therapy: Secondary | ICD-10-CM | POA: Insufficient documentation

## 2019-06-25 DIAGNOSIS — I10 Essential (primary) hypertension: Secondary | ICD-10-CM | POA: Insufficient documentation

## 2019-06-25 DIAGNOSIS — R0789 Other chest pain: Secondary | ICD-10-CM | POA: Insufficient documentation

## 2019-06-25 DIAGNOSIS — R079 Chest pain, unspecified: Secondary | ICD-10-CM | POA: Diagnosis present

## 2019-06-25 LAB — BASIC METABOLIC PANEL
Anion gap: 10 (ref 5–15)
BUN: 7 mg/dL — ABNORMAL LOW (ref 8–23)
CO2: 23 mmol/L (ref 22–32)
Calcium: 9 mg/dL (ref 8.9–10.3)
Chloride: 106 mmol/L (ref 98–111)
Creatinine, Ser: 0.68 mg/dL (ref 0.61–1.24)
GFR calc Af Amer: >60 mL/min (ref >60)
GFR calc non Af Amer: >60 mL/min (ref >60)
Glucose, Bld: 107 mg/dL — ABNORMAL HIGH (ref 70–99)
Potassium: 3.4 mmol/L — ABNORMAL LOW (ref 3.5–5.1)
Sodium: 139 mmol/L (ref 135–145)

## 2019-06-25 LAB — CBC
HCT: 36.6 % — ABNORMAL LOW (ref 39.0–52.0)
Hemoglobin: 11.9 g/dL — ABNORMAL LOW (ref 13.0–17.0)
MCH: 26.3 pg (ref 26.0–34.0)
MCHC: 32.5 g/dL (ref 30.0–36.0)
MCV: 80.8 fL (ref 80.0–100.0)
Platelets: 311 10*3/uL (ref 150–400)
RBC: 4.53 MIL/uL (ref 4.22–5.81)
RDW: 14 % (ref 11.5–15.5)
WBC: 4.7 10*3/uL (ref 4.0–10.5)
nRBC: 0 % (ref 0.0–0.2)

## 2019-06-25 LAB — D-DIMER, QUANTITATIVE: D-Dimer, Quant: 1.49 ug/mL-FEU — ABNORMAL HIGH (ref 0.00–0.50)

## 2019-06-25 LAB — TROPONIN I (HIGH SENSITIVITY): Troponin I (High Sensitivity): 14 ng/L (ref <18)

## 2019-06-25 MED ORDER — IOHEXOL 350 MG/ML SOLN
100.0000 mL | Freq: Once | INTRAVENOUS | Status: AC | PRN
  Administered 2019-06-25: 100 mL via INTRAVENOUS

## 2019-06-25 MED ORDER — KETOROLAC TROMETHAMINE 30 MG/ML IJ SOLN
15.0000 mg | Freq: Once | INTRAMUSCULAR | Status: AC
  Administered 2019-06-25: 15 mg via INTRAVENOUS
  Filled 2019-06-25: qty 1

## 2019-06-25 NOTE — ED Provider Notes (Signed)
[facility] EMERGENCY DEPARTMENT Provider Note   CSN: [id number] Arrival date & time: 06/25/19  2207     History Chief Complaint  Patient presents with  . Chest Pain    Alec Watson is a 64 y.o. male.  Patient states left-sided chest pain for the past 2 months.  Has been seen for this multiple times without a clear diagnosis.  States he comes in tonight because the chest pain is worse than usual.  It has been constant every day waxing waning in severity.  It is worse with palpation and movement and taking a deep breath.  He does feel some shortness of breath.  Denies any cough or fever.  Denies any radiation of the pain to his neck, back or arm.  Denies any nausea or vomiting or diaphoresis.  No cardiac history.  Does smoke cigarettes.  Was seen for this pain on March 12 and diagnosed with musculoskeletal pain.  Was started on lisinopril for blood pressure which she has been taking.  Denies taking blood pressure medication in the past. States the pain never goes away completely but does go up without in severity.  States he fell several months ago but is uncertain if this pain is related.  The history is provided by the patient.  Chest Pain Associated symptoms: shortness of breath   Associated symptoms: no abdominal pain, no cough, no dizziness, no fever, no headache, no nausea, no vomiting and no weakness        Past Medical History:  Diagnosis Date  . Hypertension     There are no problems to display for this patient.   Past Surgical History:  Procedure Laterality Date  . NECK SURGERY         History reviewed. No pertinent family history.  Social History   Tobacco Use  . Smoking status: Current Every Day Smoker    Packs/day: 0.50    Types: Cigarettes  . Smokeless tobacco: Never Used  Substance Use Topics  . Alcohol use: Yes    Comment: per pt every now and then.   . Drug use: No    Home Medications Prior to Admission medications   Medication Sig  Start Date End Date Taking? Authorizing Provider  ibuprofen (ADVIL) 200 MG tablet Take 400 mg by mouth every 6 (six) hours as needed.    [provider]  lisinopril (ZESTRIL) 10 MG tablet Take 1 tablet (10 mg total) by mouth daily. 06/16/19   Burgess Amor, PA-C  traMADol (ULTRAM) 50 MG tablet Take 1 tablet (50 mg total) by mouth every 6 (six) hours as needed. 05/27/19   Bethann Berkshire, MD    Allergies    Patient has no known allergies.  Review of Systems   Review of Systems  Constitutional: Negative for activity change, appetite change and fever.  HENT: Negative for congestion and rhinorrhea.   Respiratory: Positive for chest tightness and shortness of breath. Negative for cough.   Cardiovascular: Positive for chest pain.  Gastrointestinal: Negative for abdominal pain, nausea and vomiting.  Genitourinary: Negative for dysuria and hematuria.  Musculoskeletal: Negative for arthralgias and myalgias.  Skin: Negative for rash.  Neurological: Negative for dizziness, weakness and headaches.    all other systems are negative except as noted in the HPI and PMH.   Physical Exam Updated Vital Signs BP (!) 161/84   Pulse (!) 59   Temp 98.5 F (36.9 C) (Oral)   Resp 17   Ht 5\' 7"  (1.702 m)  Wt 71.7 kg   SpO2 97%   BMI 24.75 kg/m   Physical Exam Vitals and nursing note reviewed.  Constitutional:      General: He is not in acute distress.    Appearance: He is well-developed.     Comments: disheveled  HENT:     Head: Normocephalic and atraumatic.     Mouth/Throat:     Pharynx: No oropharyngeal exudate.  Eyes:     Conjunctiva/sclera: Conjunctivae normal.     Pupils: Pupils are equal, round, and reactive to light.  Neck:     Comments: No meningismus. Cardiovascular:     Rate and Rhythm: Normal rate and regular rhythm.     Heart sounds: Normal heart sounds. No murmur.  Pulmonary:     Effort: Pulmonary effort is normal. No respiratory distress.     Breath sounds: Normal  breath sounds.     Comments: Reproducible left rib tenderness, no rash Chest:     Chest wall: Tenderness present.  Abdominal:     Palpations: Abdomen is soft.     Tenderness: There is no abdominal tenderness. There is no guarding or rebound.  Musculoskeletal:        General: No tenderness. Normal range of motion.     Cervical back: Normal range of motion and neck supple.     Comments: Equal PT pulses and radial pulses bilaterally, equal grip strength  Skin:    General: Skin is warm.  Neurological:     Mental Status: He is alert and oriented to person, place, and time.     Cranial Nerves: No cranial nerve deficit.     Motor: No abnormal muscle tone.     Coordination: Coordination normal.     Comments:  5/5 strength throughout. CN 2-12 intact.Equal grip strength.   Psychiatric:        Behavior: Behavior normal.     ED Results / Procedures / Treatments   Labs (all labs ordered are listed, but only abnormal results are displayed) Labs Reviewed  CBC - Abnormal; Notable for the following components:      Result Value   Hemoglobin 11.9 (*)    HCT 36.6 (*)    All other components within normal limits  BASIC METABOLIC PANEL - Abnormal; Notable for the following components:   Potassium 3.4 (*)    Glucose, Bld 107 (*)    BUN 7 (*)    All other components within normal limits  D-DIMER, QUANTITATIVE (NOT AT [facility]) - Abnormal; Notable for the following components:   D-Dimer, Quant 1.49 (*)    All other components within normal limits  TROPONIN I (HIGH SENSITIVITY)  TROPONIN I (HIGH SENSITIVITY)    EKG EKG Interpretation  Date/Time:  Sunday June 25 2019 22:19:04 EDT Ventricular Rate:  79 PR Interval:    QRS Duration: 75 QT Interval:  426 QTC Calculation: 489 R Axis:   72 Text Interpretation: Sinus rhythm Probable left atrial enlargement Left ventricular hypertrophy Borderline prolonged QT interval Baseline wander in lead(s) V6 No significant change was found Confirmed by  Glynn Octave [phone]) on 06/25/2019 10:51:33 PM   Radiology DG Chest 2 View  Result Date: 06/25/2019 CLINICAL DATA:  Chest pain EXAM: CHEST - 2 VIEW COMPARISON:  06/16/2019 FINDINGS: Heart and mediastinal contours are within normal limits. No focal opacities or effusions. No acute bony abnormality. Aortic atherosclerosis. IMPRESSION: No active cardiopulmonary disease. Electronically Signed   By: Charlett Nose M.D.   On: 06/25/2019 23:33   CT Angio Chest/Abd/Pel  for Dissection W and/or Wo Contrast  Result Date: 06/26/2019 CLINICAL DATA:  Left chest pain EXAM: CT ANGIOGRAPHY CHEST, ABDOMEN AND PELVIS TECHNIQUE: Multidetector CT imaging through the chest, abdomen and pelvis was performed using the standard protocol during bolus administration of intravenous contrast. Multiplanar reconstructed images and MIPs were obtained and reviewed to evaluate the vascular anatomy. CONTRAST:  OMNIPAQUE IOHEXOL 350 MG/ML SOLN COMPARISON:  None. FINDINGS: CTA CHEST FINDINGS Cardiovascular: He aortic atherosclerosis, coronary artery calcifications. No evidence of aortic aneurysm or dissection. Heart is normal size. No filling defects in the pulmonary arteries to suggest pulmonary emboli. Mediastinum/Nodes: No mediastinal, hilar, or axillary adenopathy. Trachea and esophagus are unremarkable. Thyroid unremarkable. Lungs/Pleura: Linear bibasilar atelectasis. No confluent opacities or effusions. Musculoskeletal: Chest wall soft tissues are unremarkable. No acute bony abnormality. Review of the MIP images confirms the above findings. CTA ABDOMEN AND PELVIS FINDINGS VASCULAR Aorta: Aortic atherosclerosis.  No aneurysm or dissection. Celiac: Patent without evidence of aneurysm, dissection, vasculitis or significant stenosis. SMA: Patent without evidence of aneurysm, dissection, vasculitis or significant stenosis. Renals: Both renal arteries are patent without evidence of aneurysm, dissection, vasculitis, fibromuscular  dysplasia or significant stenosis. IMA: Patent without evidence of aneurysm, dissection, vasculitis or significant stenosis. Inflow: Patent without evidence of aneurysm, dissection, vasculitis or significant stenosis. Veins: No obvious venous abnormality within the limitations of this arterial phase study. Review of the MIP images confirms the above findings. NON-VASCULAR Hepatobiliary: Enhancing areas within the right hepatic lobe. These could reflect flash filling of hemangiomas. No biliary ductal dilatation. Gallbladder unremarkable. Pancreas: No focal abnormality or ductal dilatation. Spleen: No focal abnormality.  Normal size. Adrenals/Urinary Tract: No adrenal abnormality. No focal renal abnormality. No stones or hydronephrosis. Urinary bladder is unremarkable. Stomach/Bowel: Stomach, large and small bowel grossly unremarkable. Lymphatic: No adenopathy Reproductive: Prostate enlargement with calcifications. Other: No free fluid or free air. Musculoskeletal: No acute bony abnormality. Review of the MIP images confirms the above findings. IMPRESSION: Aortic atherosclerosis. No evidence of aortic aneurysm or dissection. No evidence of pulmonary embolus. Bibasilar atelectasis. Prostate enlargement. No acute findings in the chest, abdomen or pelvis. Electronically Signed   By: Charlett Nose M.D.   On: 06/26/2019 00:18    Procedures Procedures (including critical care time)  Medications Ordered in ED Medications  ketorolac (TORADOL) 30 MG/ML injection 15 mg (has no administration in time range)    ED Course  I have reviewed the triage vital signs and the nursing notes.  Pertinent labs & imaging results that were available during my care of the patient were reviewed by me and considered in my medical decision making (see chart for details).    MDM Rules/Calculators/A&P                     2 months of left-sided chest pain worse with palpation and movement.  EKG with LVH without acute  ischemia.  Low suspicion for ACS.  Ongoing pain for several months that is reproducible and worse with palpation.  Troponin negative.  Chest x-ray negative.  Pain improved with Toradol.  D-dimer slightly elevated.  CT negative for aortic  Dissection or pulmonary embolism.  No acute findings.  Remains hypertensive.  Patient states compliance with his lisinopril.  Does not have a PCP.  Troponin negative x2.  Low suspicion for ACS. Exam consistent with chest wall pain.  Consider zoster with rash not yet visible.  Resting comfortably on recheck.  Results discussed with patient.  Advised to establish care with PCP  for further evaluation of his elevated blood pressure.  Continue lisinopril.  Chest pain atypical for ACS, PE, or dissection. Return to the ED with chest pain, exertional, associated shortness of breath, nausea, vomiting, diaphoresis or other concerns. Final Clinical Impression(s) / ED Diagnoses Final diagnoses:  Atypical chest pain    Rx / DC Orders ED Discharge Orders    None       Nayan Proch, Annie Main, MD 06/26/19 [phone]

## 2019-06-25 NOTE — ED Triage Notes (Signed)
Patient complains of left-sided chest pain that stared 3 hours ago. Patient states that he feels pressure in his chest.

## 2019-06-26 LAB — TROPONIN I (HIGH SENSITIVITY): Troponin I (High Sensitivity): 15 ng/L (ref <18)

## 2019-06-26 MED ORDER — NAPROXEN 500 MG PO TABS: 500.0000 mg | ORAL_TABLET | Freq: Two times a day (BID) | ORAL | 0 refills | Status: DC | PRN

## 2019-06-26 NOTE — Discharge Instructions (Signed)
There is no evidence of heart attack or blood clot in the lung.  Follow-up with your primary doctor.  Your blood pressure is still elevated and you should continue your blood pressure medication and establish care with a primary doctor for further adjustments.  Return to the ED with worsening chest pain that becomes exertional, associated shortness of breath, nausea, vomiting, sweating, other concerns.

## 2019-07-08 ENCOUNTER — Encounter (HOSPITAL_COMMUNITY): Payer: Self-pay

## 2019-07-08 ENCOUNTER — Other Ambulatory Visit: Payer: Self-pay

## 2019-07-08 ENCOUNTER — Emergency Department (HOSPITAL_COMMUNITY)
Admission: EM | Admit: 2019-07-08 | Discharge: 2019-07-09 | Disposition: A | Discharge status: Home / Self Care | Payer: Medicaid Other | Attending: Emergency Medicine | Admitting: Emergency Medicine

## 2019-07-08 ENCOUNTER — Emergency Department (HOSPITAL_COMMUNITY): Payer: Medicaid Other

## 2019-07-08 DIAGNOSIS — I10 Essential (primary) hypertension: Secondary | ICD-10-CM

## 2019-07-08 DIAGNOSIS — F17210 Nicotine dependence, cigarettes, uncomplicated: Secondary | ICD-10-CM | POA: Diagnosis not present

## 2019-07-08 DIAGNOSIS — G8929 Other chronic pain: Secondary | ICD-10-CM

## 2019-07-08 DIAGNOSIS — R079 Chest pain, unspecified: Secondary | ICD-10-CM | POA: Insufficient documentation

## 2019-07-08 DIAGNOSIS — M25562 Pain in left knee: Secondary | ICD-10-CM | POA: Diagnosis not present

## 2019-07-08 DIAGNOSIS — Z79899 Other long term (current) drug therapy: Secondary | ICD-10-CM | POA: Insufficient documentation

## 2019-07-08 LAB — CBC WITH DIFFERENTIAL/PLATELET
Abs Immature Granulocytes: 0.02 10*3/uL (ref 0.00–0.07)
Basophils Absolute: 0 10*3/uL (ref 0.0–0.1)
Basophils Relative: 0 %
Eosinophils Absolute: 0.1 10*3/uL (ref 0.0–0.5)
Eosinophils Relative: 2 %
HCT: 38.1 % — ABNORMAL LOW (ref 39.0–52.0)
Hemoglobin: 12.1 g/dL — ABNORMAL LOW (ref 13.0–17.0)
Immature Granulocytes: 0 %
Lymphocytes Relative: 33 %
Lymphs Abs: 2.1 10*3/uL (ref 0.7–4.0)
MCH: 26.3 pg (ref 26.0–34.0)
MCHC: 31.8 g/dL (ref 30.0–36.0)
MCV: 82.8 fL (ref 80.0–100.0)
Monocytes Absolute: 0.4 10*3/uL (ref 0.1–1.0)
Monocytes Relative: 7 %
Neutro Abs: 3.6 10*3/uL (ref 1.7–7.7)
Neutrophils Relative %: 58 %
Platelets: 255 10*3/uL (ref 150–400)
RBC: 4.6 MIL/uL (ref 4.22–5.81)
RDW: 14.5 % (ref 11.5–15.5)
WBC: 6.4 10*3/uL (ref 4.0–10.5)
nRBC: 0 % (ref 0.0–0.2)

## 2019-07-08 LAB — COMPREHENSIVE METABOLIC PANEL
ALT: 13 U/L (ref 0–44)
AST: 26 U/L (ref 15–41)
Albumin: 3.8 g/dL (ref 3.5–5.0)
Alkaline Phosphatase: 69 U/L (ref 38–126)
Anion gap: 12 (ref 5–15)
BUN: 8 mg/dL (ref 8–23)
CO2: 22 mmol/L (ref 22–32)
Calcium: 9.1 mg/dL (ref 8.9–10.3)
Chloride: 105 mmol/L (ref 98–111)
Creatinine, Ser: 0.77 mg/dL (ref 0.61–1.24)
GFR calc Af Amer: >60 mL/min (ref >60)
GFR calc non Af Amer: >60 mL/min (ref >60)
Glucose, Bld: 98 mg/dL (ref 70–99)
Potassium: 4.2 mmol/L (ref 3.5–5.1)
Sodium: 139 mmol/L (ref 135–145)
Total Bilirubin: 0.5 mg/dL (ref 0.3–1.2)
Total Protein: 7.4 g/dL (ref 6.5–8.1)

## 2019-07-08 MED ORDER — LISINOPRIL 20 MG PO TABS: 20.0000 mg | ORAL_TABLET | Freq: Every day | ORAL | 0 refills | Status: DC

## 2019-07-08 NOTE — ED Provider Notes (Signed)
[facility] EMERGENCY DEPARTMENT Provider Note   CSN: [id number] Arrival date & time: 07/08/19  2134     History Chief Complaint  Patient presents with  . Chest Pain    Alec Watson is a 64 y.o. male with a history of hypertension and chronic left knee pain presenting for evaluation of a fleeting episode lasting less than 10 seconds, left-sided chest pressure which occurred earlier today.  Patient  describes this episode occurred while he was standing in his home, not exerting, and not associated with shortness of breath.  With previous episodes of this pain he did have shortness of breath and pain was worse with deep inspiration, however he states his pain was so fleeting today that he did not recognize any other symptoms.  He has been seen here multiple times with complaints of chest pain.  Most recently on March 21 at which time he had a negative cardiac work-up including a negative CT scan, normal troponins, no aortic dissection or PE.  He has not yet established care with a primary doctor.    Shortly after this episode his left knee collapsed which caused him to fall forward on his left knee.  He has had a pain at the site since this event.  He states this symptom occurs occasionally since he had a deep laceration to the knee years ago from a chainsaw injury.  He has never had any follow-up care for this injury.  Of note, nursing triage notes were reviewed with documentation that patient's chest pain has been present for 2 days.  He was asked multiple times, he states he has not had chest pain since his last visit here, and it was a less than 10-second episode of pressure as described above.  The history is provided by the patient.       Past Medical History:  Diagnosis Date  . Hypertension     There are no problems to display for this patient.   Past Surgical History:  Procedure Laterality Date  . NECK SURGERY         History reviewed. No pertinent family  history.  Social History   Tobacco Use  . Smoking status: Current Every Day Smoker    Packs/day: 0.50    Types: Cigarettes  . Smokeless tobacco: Never Used  Substance Use Topics  . Alcohol use: Yes    Comment: per pt every now and then.   . Drug use: No    Home Medications Prior to Admission medications   Medication Sig Start Date End Date Taking? Authorizing Provider  ibuprofen (ADVIL) 200 MG tablet Take 400 mg by mouth every 6 (six) hours as needed.    [provider]  lisinopril (ZESTRIL) 10 MG tablet Take 1 tablet (10 mg total) by mouth daily. 06/16/19   Evalee Jefferson, PA-C  lisinopril (ZESTRIL) 20 MG tablet Take 1 tablet (20 mg total) by mouth daily. 07/08/19   Evalee Jefferson, PA-C  naproxen (NAPROSYN) 500 MG tablet Take 1 tablet (500 mg total) by mouth 2 (two) times daily as needed. 06/26/19   Rancour, Annie Main, MD  traMADol (ULTRAM) 50 MG tablet Take 1 tablet (50 mg total) by mouth every 6 (six) hours as needed. 05/27/19   Milton Ferguson, MD    Allergies    Patient has no known allergies.  Review of Systems   Review of Systems  Constitutional: Negative for chills, diaphoresis and fever.  HENT: Negative for congestion.   Eyes: Negative.   Respiratory: Positive  for chest tightness. Negative for cough, choking, shortness of breath and wheezing.   Cardiovascular: Positive for chest pain.  Gastrointestinal: Negative for abdominal pain, nausea and vomiting.  Genitourinary: Negative.   Musculoskeletal: Positive for arthralgias. Negative for joint swelling and neck pain.  Skin: Negative.  Negative for rash and wound.  Neurological: Negative for dizziness, weakness, light-headedness, numbness and headaches.  Psychiatric/Behavioral: Negative.     Physical Exam Updated Vital Signs BP (!) 165/93   Pulse 66   Temp 97.9 F (36.6 C)   Resp 18   Ht 5\' 7"  (1.702 m)   Wt 71.7 kg   SpO2 96%   BMI 24.75 kg/m   Physical Exam Vitals and nursing note reviewed.  Constitutional:       Appearance: He is well-developed.  HENT:     Head: Normocephalic and atraumatic.  Eyes:     Conjunctiva/sclera: Conjunctivae normal.  Cardiovascular:     Rate and Rhythm: Normal rate and regular rhythm.     Heart sounds: Normal heart sounds.  Pulmonary:     Effort: Pulmonary effort is normal. No respiratory distress.     Breath sounds: Rhonchi present. No wheezing.     Comments: Rhonchorous breath sounds bilateral lower lung fields, mostly with expiration.  Clears with cough. Abdominal:     General: Bowel sounds are normal.     Palpations: Abdomen is soft.     Tenderness: There is no abdominal tenderness.  Musculoskeletal:        General: Normal range of motion.     Cervical back: Normal range of motion.     Left knee: Erythema present. No deformity or effusion. Tenderness present. No medial joint line or lateral joint line tenderness. No LCL laxity, MCL laxity, ACL laxity or PCL laxity.    Comments: No ligament laxity noted with exam of left knee.  He does have some mild discomfort with varus strain.  He is also tender to palpation anterior left patella with mild erythema and a superficial abrasion.  No effusion.  Patient displays full range of motion without difficulty of the left knee.  He does hold the knee and slight flexion at rest when supine.  Skin:    General: Skin is warm and dry.  Neurological:     Mental Status: He is alert.     ED Results / Procedures / Treatments   Labs (all labs ordered are listed, but only abnormal results are displayed) Labs Reviewed  CBC WITH DIFFERENTIAL/PLATELET - Abnormal; Notable for the following components:      Result Value   Hemoglobin 12.1 (*)    HCT 38.1 (*)    All other components within normal limits  COMPREHENSIVE METABOLIC PANEL    EKG EKG Interpretation  Date/Time:  Saturday July 08 2019 21:50:22 EDT Ventricular Rate:  64 PR Interval:    QRS Duration: 78 QT Interval:  462 QTC Calculation: 477 R Axis:   66 Text  Interpretation: Sinus rhythm Left atrial enlargement Left ventricular hypertrophy Borderline prolonged QT interval Since last tracing 25 Jun 2019 ST no longer elevated in Lateral leads Confirmed by 27 Jun 2019 (Devoria Albe) on 07/08/2019 11:35:28 PM   Radiology DG Chest 2 View  Result Date: 07/08/2019 CLINICAL DATA:  Pain and weakness. EXAM: CHEST - 2 VIEW COMPARISON:  June 25, 2019 FINDINGS: Heart size is mildly enlarged. Aortic calcifications are noted. There is no pneumothorax. No large pleural effusion. No evidence for a focal infiltrate. No acute osseous abnormality. IMPRESSION: No active cardiopulmonary disease.  Electronically Signed   By: Katherine Mantle M.D.   On: 07/08/2019 23:19   DG Knee Complete 4 Views Left  Result Date: 07/08/2019 CLINICAL DATA:  Knee pain. EXAM: LEFT KNEE - COMPLETE 4+ VIEW COMPARISON:  None. FINDINGS: No evidence of fracture, dislocation, or joint effusion. No evidence of arthropathy or other focal bone abnormality. Soft tissues are unremarkable. IMPRESSION: Negative. Electronically Signed   By: Katherine Mantle M.D.   On: 07/08/2019 23:20    Procedures Procedures (including critical care time)  Medications Ordered in ED Medications - No data to display  ED Course  I have reviewed the triage vital signs and the nursing notes.  Pertinent labs & imaging results that were available during my care of the patient were reviewed by me and considered in my medical decision making (see chart for details).    MDM Rules/Calculators/A&P                      Patient with fleeting nonspecific chest pain of less than 10 seconds duration which has been resolved for hours prior to arrival here.  His chest x-ray is clear, EKG with no acute findings, with such fleeting symptom, troponin testing would not be reasonable, doubt ACS.  His knee x-ray is negative for any acute injuries.  He may have chronic ligament injury but without an obvious disruption.  He was given a knee brace  to protect the joint and referred to orthopedics.  He was also given referrals once again for obtaining a primary MD.  He was fairly hypertensive upon first arrival here.  He states he is compliant with the lisinopril he was prescribed last month.  This is a 10 mg strength tablet.  He was instructed to double his remaining tablets for 20 mg daily, he was also given a new prescription for a 20 mg tablet to start when his old prescription is completed.  I have also put in a consult to our social worker team here to reach out to this patient to see if they can assist him navigating the medical system including finding a primary  MD.  Final Clinical Impression(s) / ED Diagnoses Final diagnoses:  Chronic pain of left knee  Nonspecific chest pain  Hypertension, unspecified type    Rx / DC Orders ED Discharge Orders         Ordered    Consult to social work    Comments: Patient seems to be having difficulty obtaining followup medical care.  He has been to the ED multiple visits for hypertension urgency and has yet to obtain a primary MD despite multiple referrals given.  I suspect he may have difficulty navigating the system and hoped social work could help get him established with a primary MD and other community resources.  He states is currently living in a motel,  There may also be a homeless issue here. Also has no transportation.  Provider:  (Not yet assigned)   07/09/19 0004    lisinopril (ZESTRIL) 20 MG tablet  Daily     07/08/19 2355           Burgess Amor, PA-C 07/09/19 0101    Benjiman Core, MD 07/09/19 1453

## 2019-07-08 NOTE — ED Triage Notes (Signed)
Pt comes in c/o recurrent CP. York Spaniel that this time it has been hurting for 2 days.

## 2019-07-08 NOTE — Discharge Instructions (Signed)
Your xrays and exam are reassuring tonight, but you do need to get a local primary MD to continue helping you with your blood pressure and medication refills.  You may also need to see an orthopedic MD for further assistance with your knee.  Use the brace to protect your knee.  Dr. Romeo Apple may be able to see you for this problem - his name is listed below as well.

## 2019-07-21 ENCOUNTER — Ambulatory Visit: Payer: Self-pay | Attending: Internal Medicine

## 2019-07-21 DIAGNOSIS — Z23 Encounter for immunization: Secondary | ICD-10-CM

## 2019-07-21 NOTE — Progress Notes (Signed)
   Covid-19 Vaccination Clinic  Name:  Alec Watson    MRN: [id number] DOB: [birth year]  07/21/2019  [name] was observed post Covid-19 immunization for 15 minutes without incident. He was provided with Vaccine Information Sheet and instruction to access the V-Safe system.   [name] was instructed to call 911 with any severe reactions post vaccine: Marland Kitchen Difficulty breathing  . Swelling of face and throat  . A fast heartbeat  . A bad rash all over body  . Dizziness and weakness   Immunizations Administered    Name Date Dose VIS Date Route   Moderna COVID-19 Vaccine 07/21/2019 11:53 AM 0.5 mL 03/07/2019 Intramuscular   Manufacturer: Moderna   Lot: [id number]   NDC: 35701-779-39

## 2019-07-22 ENCOUNTER — Emergency Department (HOSPITAL_COMMUNITY)
Admission: EM | Admit: 2019-07-22 | Discharge: 2019-07-22 | Disposition: A | Discharge status: Home / Self Care | Payer: Medicaid Other | Attending: Emergency Medicine | Admitting: Emergency Medicine

## 2019-07-22 ENCOUNTER — Other Ambulatory Visit: Payer: Self-pay

## 2019-07-22 ENCOUNTER — Encounter (HOSPITAL_COMMUNITY): Payer: Self-pay | Admitting: Emergency Medicine

## 2019-07-22 DIAGNOSIS — M25562 Pain in left knee: Secondary | ICD-10-CM | POA: Insufficient documentation

## 2019-07-22 DIAGNOSIS — G8929 Other chronic pain: Secondary | ICD-10-CM | POA: Insufficient documentation

## 2019-07-22 DIAGNOSIS — I10 Essential (primary) hypertension: Secondary | ICD-10-CM | POA: Diagnosis not present

## 2019-07-22 DIAGNOSIS — F17210 Nicotine dependence, cigarettes, uncomplicated: Secondary | ICD-10-CM | POA: Diagnosis not present

## 2019-07-22 MED ORDER — ACETAMINOPHEN 325 MG PO TABS
650.0000 mg | ORAL_TABLET | Freq: Once | ORAL | Status: DC
  Filled 2019-07-22: qty 2

## 2019-07-22 NOTE — Discharge Instructions (Addendum)
Please take Tylenol (acetaminophen) to relieve your pain.  You may take tylenol, up to 1,000 mg (two extra strength pills).  Do not take more than 3,000 mg tylenol in a 24 hour period.  Please check all medication labels as many medications such as pain and cold medications may contain tylenol. Please do not drink alcohol while taking this medication.   Please get voltaren.  It is available over the counter.  Please apply this to your knee as directed on the box.    Your blood pressure was very high.  It is important that you are taking your medications.  Untreated high blood pressure can cause long term damage even if you do not have any symptoms .

## 2019-07-22 NOTE — ED Provider Notes (Signed)
[facility] EMERGENCY DEPARTMENT Provider Note   CSN: [id number] Arrival date & time: 07/22/19  1753     History Chief Complaint  Patient presents with  . Knee weakness    Alec Watson is a 64 y.o. male with a past medical history of hypertension presents today for evaluation of chronic left knee pain.  He reports that he has had intermittent knee pain for over 1 month.  He states that it occasionally it gives out on him.  He denies any constant weakness, numbness or tingling.  Chart review shows that he is normally hypertensive when here consistent with his blood pressure today.  He states that he has his blood pressure medications at home.  He denies any chest pain, headache, shortness of breath or leg swelling. He states that he has been taking Excedrin for his knee pain.  HPI     Past Medical History:  Diagnosis Date  . Hypertension     There are no problems to display for this patient.   Past Surgical History:  Procedure Laterality Date  . NECK SURGERY         No family history on file.  Social History   Tobacco Use  . Smoking status: Current Every Day Smoker    Packs/day: 0.50    Types: Cigarettes  . Smokeless tobacco: Never Used  Substance Use Topics  . Alcohol use: Yes    Comment: per pt every now and then.   . Drug use: No    Home Medications Prior to Admission medications   Medication Sig Start Date End Date Taking? Authorizing Provider  ibuprofen (ADVIL) 200 MG tablet Take 400 mg by mouth every 6 (six) hours as needed.    [provider]  lisinopril (ZESTRIL) 10 MG tablet Take 1 tablet (10 mg total) by mouth daily. 06/16/19   Burgess Amor, PA-C  lisinopril (ZESTRIL) 20 MG tablet Take 1 tablet (20 mg total) by mouth daily. 07/08/19   Burgess Amor, PA-C  naproxen (NAPROSYN) 500 MG tablet Take 1 tablet (500 mg total) by mouth 2 (two) times daily as needed. 06/26/19   Rancour, Jeannett Senior, MD  traMADol (ULTRAM) 50 MG tablet Take 1 tablet (50 mg  total) by mouth every 6 (six) hours as needed. 05/27/19   Bethann Berkshire, MD    Allergies    Patient has no known allergies.  Review of Systems   Review of Systems  Constitutional: Negative for chills and fever.  Cardiovascular: Negative for chest pain.  Gastrointestinal: Negative for abdominal pain.  Musculoskeletal: Negative for neck pain.       Chronic pain in left knee with occasional "giving out."    Skin: Negative for color change and wound.  Psychiatric/Behavioral: Negative for confusion.  All other systems reviewed and are negative.   Physical Exam Updated Vital Signs BP (!) 186/96 (BP Location: Right Arm)   Pulse 85   Temp 98.4 F (36.9 C) (Oral)   Resp 18   Ht 5\' 7"  (1.702 m)   Wt 70.8 kg   SpO2 96%   BMI 24.43 kg/m   Physical Exam Vitals and nursing note reviewed.  Constitutional:      General: He is not in acute distress. Cardiovascular:     Comments: 2+ pt/dp pulse left leg.  Musculoskeletal:     Left lower leg: No edema (No pain or edema.  ).     Comments: Left knee is grossly stable to anterior/posterior drawer test, valgus/varus stress.  He does  have pain with valgus and varus stress.   No significant edema.  Knee strength is intact through knee flexion and extension.  Skin:    Comments: No abnormal erythema, warmth, color changes ecchymosis or significant wounds present on left knee.  Neurological:     Mental Status: He is alert.     Sensory: No sensory deficit (Sensation intact to light touch to left lower leg).  Psychiatric:        Mood and Affect: Mood normal.     ED Results / Procedures / Treatments   Labs (all labs ordered are listed, but only abnormal results are displayed) Labs Reviewed - No data to display  EKG None  Radiology No results found.  Procedures Procedures (including critical care time)  Medications Ordered in ED Medications  acetaminophen (TYLENOL) tablet 650 mg (has no administration in time range)    ED Course   I have reviewed the triage vital signs and the nursing notes.  Pertinent labs & imaging results that were available during my care of the patient were reviewed by me and considered in my medical decision making (see chart for details).    MDM Rules/Calculators/A&P                     Patient is a 64 year old man who presents today for evaluation of chronic pain in the left knee.  He has mild pain with valgus and varus stress however otherwise he is neurovascularly intact without gross instability.  He was given a knee brace at a previous visit which he states he still has.  Recommended to use his knee brace.  In addition recommended he switch to plain Tylenol instead of taking Excedrin for his pain as I suspect the caffeine may be contributing to his hypertension.  We discussed the importance of maintaining compliance with his antihypertensives along with the potential consequences of failing to do this and he states his understanding.  Recommended he follow-up with orthopedics.  Return precautions were discussed with patient who states their understanding.  At the time of discharge patient denied any unaddressed complaints or concerns.  Patient is agreeable for discharge home.  Note: Portions of this report may have been transcribed using voice recognition software. Every effort was made to ensure accuracy; however, inadvertent computerized transcription errors may be present  Final Clinical Impression(s) / ED Diagnoses Final diagnoses:  Chronic pain of left knee  Hypertension, unspecified type    Rx / DC Orders ED Discharge Orders    None       Lorin Glass, PA-C 07/22/19 1930    Fredia Sorrow, MD 07/31/19 [phone]

## 2019-07-22 NOTE — ED Notes (Signed)
Pt not in room when this nurse entered room to review d/c instructions and administer tylenol. EDP made aware of this.

## 2019-07-22 NOTE — ED Triage Notes (Addendum)
Patient c/o bilateral knee weakness that has been intermittent x1 month. Denies any pain. Per patient just "get weak and give out." Per patient seen here in ED, referred to ortho but has been unable to get appoinment.

## 2019-08-18 ENCOUNTER — Emergency Department (HOSPITAL_COMMUNITY)
Admission: EM | Admit: 2019-08-18 | Discharge: 2019-08-18 | Disposition: A | Discharge status: Home / Self Care | Payer: Medicaid Other | Attending: Emergency Medicine | Admitting: Emergency Medicine

## 2019-08-18 ENCOUNTER — Encounter (HOSPITAL_COMMUNITY): Payer: Self-pay | Admitting: *Deleted

## 2019-08-18 ENCOUNTER — Emergency Department (HOSPITAL_COMMUNITY): Payer: Medicaid Other

## 2019-08-18 DIAGNOSIS — N393 Stress incontinence (female) (male): Secondary | ICD-10-CM | POA: Diagnosis not present

## 2019-08-18 DIAGNOSIS — Z79899 Other long term (current) drug therapy: Secondary | ICD-10-CM | POA: Insufficient documentation

## 2019-08-18 DIAGNOSIS — M5440 Lumbago with sciatica, unspecified side: Secondary | ICD-10-CM | POA: Insufficient documentation

## 2019-08-18 DIAGNOSIS — I10 Essential (primary) hypertension: Secondary | ICD-10-CM | POA: Diagnosis not present

## 2019-08-18 DIAGNOSIS — F17210 Nicotine dependence, cigarettes, uncomplicated: Secondary | ICD-10-CM | POA: Insufficient documentation

## 2019-08-18 LAB — COMPREHENSIVE METABOLIC PANEL
ALT: 11 U/L (ref 0–44)
AST: 17 U/L (ref 15–41)
Albumin: 3.2 g/dL — ABNORMAL LOW (ref 3.5–5.0)
Alkaline Phosphatase: 53 U/L (ref 38–126)
Anion gap: 7 (ref 5–15)
BUN: 9 mg/dL (ref 8–23)
CO2: 23 mmol/L (ref 22–32)
Calcium: 8.7 mg/dL — ABNORMAL LOW (ref 8.9–10.3)
Chloride: 106 mmol/L (ref 98–111)
Creatinine, Ser: 0.75 mg/dL (ref 0.61–1.24)
GFR calc Af Amer: >60 mL/min (ref >60)
GFR calc non Af Amer: >60 mL/min (ref >60)
Glucose, Bld: 98 mg/dL (ref 70–99)
Potassium: 3.9 mmol/L (ref 3.5–5.1)
Sodium: 136 mmol/L (ref 135–145)
Total Bilirubin: 0.3 mg/dL (ref 0.3–1.2)
Total Protein: 6.4 g/dL — ABNORMAL LOW (ref 6.5–8.1)

## 2019-08-18 LAB — URINALYSIS, ROUTINE W REFLEX MICROSCOPIC
Bilirubin Urine: NEGATIVE
Glucose, UA: NEGATIVE mg/dL
Hgb urine dipstick: NEGATIVE
Ketones, ur: NEGATIVE mg/dL
Leukocytes,Ua: NEGATIVE
Nitrite: NEGATIVE
Protein, ur: NEGATIVE mg/dL
Specific Gravity, Urine: 1.006 (ref 1.005–1.030)
pH: 6 (ref 5.0–8.0)

## 2019-08-18 LAB — CBC WITH DIFFERENTIAL/PLATELET
Abs Immature Granulocytes: 0.02 10*3/uL (ref 0.00–0.07)
Basophils Absolute: 0 10*3/uL (ref 0.0–0.1)
Basophils Relative: 0 %
Eosinophils Absolute: 0.1 10*3/uL (ref 0.0–0.5)
Eosinophils Relative: 2 %
HCT: 36.3 % — ABNORMAL LOW (ref 39.0–52.0)
Hemoglobin: 11.5 g/dL — ABNORMAL LOW (ref 13.0–17.0)
Immature Granulocytes: 0 %
Lymphocytes Relative: 32 %
Lymphs Abs: 1.9 10*3/uL (ref 0.7–4.0)
MCH: 25.4 pg — ABNORMAL LOW (ref 26.0–34.0)
MCHC: 31.7 g/dL (ref 30.0–36.0)
MCV: 80.1 fL (ref 80.0–100.0)
Monocytes Absolute: 0.5 10*3/uL (ref 0.1–1.0)
Monocytes Relative: 8 %
Neutro Abs: 3.5 10*3/uL (ref 1.7–7.7)
Neutrophils Relative %: 58 %
Platelets: 246 10*3/uL (ref 150–400)
RBC: 4.53 MIL/uL (ref 4.22–5.81)
RDW: 13.1 % (ref 11.5–15.5)
WBC: 6 10*3/uL (ref 4.0–10.5)
nRBC: 0 % (ref 0.0–0.2)

## 2019-08-18 MED ORDER — PREDNISONE 10 MG PO TABS
20.0000 mg | ORAL_TABLET | Freq: Every day | ORAL | 0 refills | Status: DC
Start: 2019-08-18 — End: 2019-12-17

## 2019-08-18 MED ORDER — GADOBUTROL 1 MMOL/ML IV SOLN
7.0000 mL | Freq: Once | INTRAVENOUS | Status: AC | PRN
  Administered 2019-08-18: 7 mL via INTRAVENOUS

## 2019-08-18 NOTE — ED Notes (Signed)
Pt to MRI

## 2019-08-18 NOTE — ED Notes (Signed)
Patient denies pain and is resting comfortably.  

## 2019-08-18 NOTE — ED Provider Notes (Signed)
[facility] EMERGENCY DEPARTMENT Provider Note   CSN: [id number] Arrival date & time: 08/18/19  1547     History Chief Complaint  Patient presents with  . Urinary Incontinence    Alec Watson is a 64 y.o. male.  Patient complains of loss control of his bladder along with back pain and leg weakness  The history is provided by the patient and medical records. No language interpreter was used.  Back Pain Location:  Lumbar spine Quality:  Aching Radiates to:  Does not radiate Pain severity:  Moderate Pain is:  Worse during the day Onset quality:  Sudden Timing:  Constant Progression:  Worsening Chronicity:  New Context: not emotional stress   Relieved by:  Nothing Worsened by:  Nothing Associated symptoms: no abdominal pain, no chest pain and no headaches        Past Medical History:  Diagnosis Date  . Hypertension     There are no problems to display for this patient.   Past Surgical History:  Procedure Laterality Date  . NECK SURGERY         History reviewed. No pertinent family history.  Social History   Tobacco Use  . Smoking status: Current Every Day Smoker    Packs/day: 0.50    Types: Cigarettes  . Smokeless tobacco: Never Used  Substance Use Topics  . Alcohol use: Yes    Comment: per pt every now and then.   . Drug use: No    Home Medications Prior to Admission medications   Medication Sig Start Date End Date Taking? Authorizing Provider  ibuprofen (ADVIL) 200 MG tablet Take 400 mg by mouth every 6 (six) hours as needed.   Yes [provider]  lisinopril (ZESTRIL) 20 MG tablet Take 1 tablet (20 mg total) by mouth daily. 07/08/19  Yes Idol, Raynelle Fanning, PA-C  predniSONE (DELTASONE) 10 MG tablet Take 2 tablets (20 mg total) by mouth daily. 08/18/19   Bethann Berkshire, MD    Allergies    Patient has no known allergies.  Review of Systems   Review of Systems  Constitutional: Negative for appetite change and fatigue.  HENT: Negative  for congestion, ear discharge and sinus pressure.   Eyes: Negative for discharge.  Respiratory: Negative for cough.   Cardiovascular: Negative for chest pain.  Gastrointestinal: Negative for abdominal pain and diarrhea.  Genitourinary: Negative for frequency and hematuria.       Loss control bladder  Musculoskeletal: Positive for back pain.  Skin: Negative for rash.  Neurological: Negative for seizures and headaches.  Psychiatric/Behavioral: Negative for hallucinations.    Physical Exam Updated Vital Signs BP (!) 197/108 (BP Location: Left Arm)   Pulse (!) 58   Temp 98.2 F (36.8 C) (Oral)   Resp 18   Ht 5\' 7"  (1.702 m)   Wt 72.6 kg   SpO2 100%   BMI 25.06 kg/m   Physical Exam Vitals reviewed.  Constitutional:      Appearance: He is well-developed.  HENT:     Head: Normocephalic.     Nose: Nose normal.  Eyes:     General: No scleral icterus.    Conjunctiva/sclera: Conjunctivae normal.  Neck:     Thyroid: No thyromegaly.  Cardiovascular:     Rate and Rhythm: Normal rate and regular rhythm.     Heart sounds: No murmur. No friction rub. No gallop.   Pulmonary:     Breath sounds: No stridor. No wheezing or rales.  Chest:  Chest wall: No tenderness.  Abdominal:     General: There is no distension.     Tenderness: There is no abdominal tenderness. There is no rebound.  Genitourinary:    Comments: Rectal exam normal Musculoskeletal:        General: Normal range of motion.     Cervical back: Neck supple.     Comments: Tender lumbar spine  Lymphadenopathy:     Cervical: No cervical adenopathy.  Skin:    Findings: No erythema or rash.  Neurological:     Mental Status: He is alert and oriented to person, place, and time.     Motor: No abnormal muscle tone.     Coordination: Coordination normal.  Psychiatric:        Behavior: Behavior normal.     ED Results / Procedures / Treatments   Labs (all labs ordered are listed, but only abnormal results are  displayed) Labs Reviewed  URINALYSIS, ROUTINE W REFLEX MICROSCOPIC - Abnormal; Notable for the following components:      Result Value   Color, Urine STRAW (*)    All other components within normal limits  CBC WITH DIFFERENTIAL/PLATELET - Abnormal; Notable for the following components:   Hemoglobin 11.5 (*)    HCT 36.3 (*)    MCH 25.4 (*)    All other components within normal limits  COMPREHENSIVE METABOLIC PANEL - Abnormal; Notable for the following components:   Calcium 8.7 (*)    Total Protein 6.4 (*)    Albumin 3.2 (*)    All other components within normal limits  URINE CULTURE    EKG None  Radiology MR Lumbar Spine W Wo Contrast  Result Date: 08/18/2019 CLINICAL DATA:  Lower back pain, question of cauda equina syndrome EXAM: MRI LUMBAR SPINE WITHOUT AND WITH CONTRAST TECHNIQUE: Multiplanar and multiecho pulse sequences of the lumbar spine were obtained without and with intravenous contrast. CONTRAST:  48mL GADAVIST GADOBUTROL 1 MMOL/ML IV SOLN COMPARISON:  None. FINDINGS: Segmentation: There are 5 non-rib bearing lumbar type vertebral bodies with the last intervertebral disc space labeled as L5-S1. Alignment:  Normal Vertebrae: The vertebral body heights are well maintained. No fracture, marrow edema,or pathologic marrow infiltration. Conus medullaris and cauda equina: Conus extends to the L1-L2 level. Conus and cauda equina appear normal. No areas of abnormal intramedullary or leptomeningeal enhancement is seen. Paraspinal and other soft tissues: The paraspinal soft tissues and visualized retroperitoneal structures are unremarkable. The sacroiliac joints are intact. Disc levels: T12-L1:  No significant canal or neural foraminal narrowing. L1-L2:   No significant canal or neural foraminal narrowing. L2-L3: There is a minimal broad-based disc bulge, however no significant canal or neural foraminal narrowing. L3-L4: There is a minimal broad-based disc bulge with ligamentum flavum  hypertrophy which causes mild effacement anterior thecal sac. L4-L5: There is a broad-based disc bulge, facet arthrosis and ligamentum flavum hypertrophy which causes mild bilateral neural foraminal narrowing and mild effacement anterior thecal sac. L5-S1: There is a broad-based disc bulge with a right lateral recess disc protrusion which contacts the descending right S1 nerve root. Facet arthrosis and ligamentum flavum hypertrophy are present. There is moderate bilateral neural foraminal narrowing and mild effacement anterior thecal sac. 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 There is moderate bilateral neural foraminal narrowing and mild effacement anterior thecal sac. Electronically Signed   By: Jonna Clark M.D.   On: 08/18/2019 20:22  Procedures Procedures (including critical care time)  Medications Ordered in ED Medications  gadobutrol (GADAVIST) 1 MMOL/ML injection 7 mL (7 mLs Intravenous Contrast Given 08/18/19 1946)    ED Course  I have reviewed the triage vital signs and the nursing notes.  Pertinent labs & imaging results that were available during my care of the patient were reviewed by me and considered in my medical decision making (see chart for details).    CRITICAL CARE Performed by: Milton Ferguson Total critical care time:35 minutes Critical care time was exclusive of separately billable procedures and treating other patients. Critical care was necessary to treat or prevent imminent or life-threatening deterioration. Critical care was time spent personally by me on the following activities: development of treatment plan with patient and/or surrogate as well as nursing, discussions with consultants, evaluation of patient's response to treatment, examination of patient, obtaining history from patient or surrogate, ordering and performing treatments and interventions,  ordering and review of laboratory studies, ordering and review of radiographic studies, pulse oximetry and re-evaluation of patient's condition.  MDM Rules/Calculators/A&P                      MRI did not show any cauda equina.  I spoke with neurosurgery and they will follow up the patient for his back pain and leg weakness.  He will be given some prednisone to help with that.  He is also referred to urology for his incontinence    This patient presents to the ED for concern of back pain and loss of bladder control, this involves an extensive number of treatment options, and is a complaint that carries with it a high risk of complications and morbidity.  The differential diagnosis includes sciatica or cauda equinas Lab Tests:   I Ordered, reviewed, and interpreted labs, which included CBC chemistries which showed mild edema  Medicines ordered:     Imaging Studies ordered:   I ordered imaging studies which included MRI lumbar spine and  I independently visualized and interpreted imaging which showed disc disease L5-S1  Additional history obtained:   Additional history obtained from record  Previous records obtained and reviewed   Consultations Obtained:   I consulted neurosurgery and discussed lab and imaging findings  Reevaluation:  After the interventions stated above, I reevaluated the patient and found no change  Critical Interventions:  .   Final Clinical Impression(s) / ED Diagnoses Final diagnoses:  Urinary incontinence, male, stress  Acute right-sided low back pain with sciatica, sciatica laterality unspecified    Rx / DC Orders ED Discharge Orders         Ordered    predniSONE (DELTASONE) 10 MG tablet  Daily     08/18/19 2229           Milton Ferguson, MD 08/19/19 1002

## 2019-08-18 NOTE — ED Triage Notes (Signed)
States he has no control of bladder onset today

## 2019-08-18 NOTE — Discharge Instructions (Signed)
Follow-up with [facility] for your bladder problems.  Follow-up with Dr. Lovell Sheehan or one of his partners for your back pain and lower leg problems

## 2019-08-20 LAB — URINE CULTURE
Culture: <10000 — AB
Special Requests: NORMAL

## 2019-08-22 ENCOUNTER — Ambulatory Visit: Payer: Self-pay | Attending: Internal Medicine

## 2019-08-22 DIAGNOSIS — Z23 Encounter for immunization: Secondary | ICD-10-CM

## 2019-08-22 NOTE — Progress Notes (Signed)
   Covid-19 Vaccination Clinic  Name:  TEDDIE MEHTA    MRN: [id number] DOB: 1956/03/26  08/22/2019  Mr. Dasch was observed post Covid-19 immunization for 15 minutes without incident. He was provided with Vaccine Information Sheet and instruction to access the V-Safe system.   Mr. Haberman was instructed to call 911 with any severe reactions post vaccine: Marland Kitchen Difficulty breathing  . Swelling of face and throat  . A fast heartbeat  . A bad rash all over body  . Dizziness and weakness   Immunizations Administered    Name Date Dose VIS Date Route   Moderna COVID-19 Vaccine 08/22/2019 11:39 AM 0.5 mL 03/2019 Intramuscular   Manufacturer: Gala Murdoch   Lot: [id number]   NDC: 75883-254-98

## 2019-12-17 ENCOUNTER — Other Ambulatory Visit: Payer: Self-pay

## 2019-12-17 ENCOUNTER — Emergency Department (HOSPITAL_COMMUNITY): Payer: Medicaid Other

## 2019-12-17 ENCOUNTER — Encounter (HOSPITAL_COMMUNITY): Payer: Self-pay | Admitting: *Deleted

## 2019-12-17 ENCOUNTER — Inpatient Hospital Stay (HOSPITAL_COMMUNITY)
Admission: EM | Admit: 2019-12-17 | Discharge: 2019-12-25 | DRG: 065 | Disposition: A | Discharge status: Skilled Nursing Facility | Payer: Medicaid Other | Attending: Family Medicine | Admitting: Family Medicine

## 2019-12-17 DIAGNOSIS — Z20822 Contact with and (suspected) exposure to covid-19: Secondary | ICD-10-CM | POA: Diagnosis present

## 2019-12-17 DIAGNOSIS — R29706 NIHSS score 6: Secondary | ICD-10-CM | POA: Diagnosis not present

## 2019-12-17 DIAGNOSIS — W19XXXA Unspecified fall, initial encounter: Secondary | ICD-10-CM | POA: Diagnosis present

## 2019-12-17 DIAGNOSIS — R29704 NIHSS score 4: Secondary | ICD-10-CM | POA: Diagnosis not present

## 2019-12-17 DIAGNOSIS — R29810 Facial weakness: Secondary | ICD-10-CM | POA: Diagnosis present

## 2019-12-17 DIAGNOSIS — R2681 Unsteadiness on feet: Secondary | ICD-10-CM | POA: Diagnosis present

## 2019-12-17 DIAGNOSIS — E8809 Other disorders of plasma-protein metabolism, not elsewhere classified: Secondary | ICD-10-CM | POA: Diagnosis not present

## 2019-12-17 DIAGNOSIS — R471 Dysarthria and anarthria: Secondary | ICD-10-CM | POA: Diagnosis present

## 2019-12-17 DIAGNOSIS — R4781 Slurred speech: Secondary | ICD-10-CM | POA: Diagnosis present

## 2019-12-17 DIAGNOSIS — G9349 Other encephalopathy: Secondary | ICD-10-CM | POA: Diagnosis present

## 2019-12-17 DIAGNOSIS — D509 Iron deficiency anemia, unspecified: Secondary | ICD-10-CM

## 2019-12-17 DIAGNOSIS — Y92007 Garden or yard of unspecified non-institutional (private) residence as the place of occurrence of the external cause: Secondary | ICD-10-CM

## 2019-12-17 DIAGNOSIS — G8191 Hemiplegia, unspecified affecting right dominant side: Secondary | ICD-10-CM | POA: Diagnosis present

## 2019-12-17 DIAGNOSIS — I10 Essential (primary) hypertension: Secondary | ICD-10-CM

## 2019-12-17 DIAGNOSIS — I083 Combined rheumatic disorders of mitral, aortic and tricuspid valves: Secondary | ICD-10-CM | POA: Diagnosis present

## 2019-12-17 DIAGNOSIS — F17210 Nicotine dependence, cigarettes, uncomplicated: Secondary | ICD-10-CM | POA: Diagnosis present

## 2019-12-17 DIAGNOSIS — I639 Cerebral infarction, unspecified: Secondary | ICD-10-CM

## 2019-12-17 DIAGNOSIS — R4701 Aphasia: Secondary | ICD-10-CM | POA: Diagnosis present

## 2019-12-17 DIAGNOSIS — F0150 Vascular dementia without behavioral disturbance: Secondary | ICD-10-CM | POA: Diagnosis present

## 2019-12-17 DIAGNOSIS — G9389 Other specified disorders of brain: Secondary | ICD-10-CM | POA: Diagnosis present

## 2019-12-17 DIAGNOSIS — R29707 NIHSS score 7: Secondary | ICD-10-CM | POA: Diagnosis not present

## 2019-12-17 DIAGNOSIS — R29708 NIHSS score 8: Secondary | ICD-10-CM | POA: Diagnosis present

## 2019-12-17 LAB — COMPREHENSIVE METABOLIC PANEL
ALT: 9 U/L (ref 0–44)
AST: 14 U/L — ABNORMAL LOW (ref 15–41)
Albumin: 3.3 g/dL — ABNORMAL LOW (ref 3.5–5.0)
Alkaline Phosphatase: 61 U/L (ref 38–126)
Anion gap: 5 (ref 5–15)
BUN: 10 mg/dL (ref 8–23)
CO2: 25 mmol/L (ref 22–32)
Calcium: 8.8 mg/dL — ABNORMAL LOW (ref 8.9–10.3)
Chloride: 108 mmol/L (ref 98–111)
Creatinine, Ser: 0.83 mg/dL (ref 0.61–1.24)
GFR calc Af Amer: >60 mL/min (ref >60)
GFR calc non Af Amer: >60 mL/min (ref >60)
Glucose, Bld: 108 mg/dL — ABNORMAL HIGH (ref 70–99)
Potassium: 3.8 mmol/L (ref 3.5–5.1)
Sodium: 138 mmol/L (ref 135–145)
Total Bilirubin: 0.4 mg/dL (ref 0.3–1.2)
Total Protein: 6.4 g/dL — ABNORMAL LOW (ref 6.5–8.1)

## 2019-12-17 LAB — SARS CORONAVIRUS 2 BY RT PCR (HOSPITAL ORDER, PERFORMED IN ~~LOC~~ HOSPITAL LAB): SARS Coronavirus 2: NEGATIVE

## 2019-12-17 LAB — APTT: aPTT: 28 seconds (ref 24–36)

## 2019-12-17 LAB — DIFFERENTIAL
Abs Immature Granulocytes: 0.01 10*3/uL (ref 0.00–0.07)
Basophils Absolute: 0 10*3/uL (ref 0.0–0.1)
Basophils Relative: 0 %
Eosinophils Absolute: 0.1 10*3/uL (ref 0.0–0.5)
Eosinophils Relative: 2 %
Immature Granulocytes: 0 %
Lymphocytes Relative: 30 %
Lymphs Abs: 1.7 10*3/uL (ref 0.7–4.0)
Monocytes Absolute: 0.5 10*3/uL (ref 0.1–1.0)
Monocytes Relative: 9 %
Neutro Abs: 3.2 10*3/uL (ref 1.7–7.7)
Neutrophils Relative %: 59 %

## 2019-12-17 LAB — URINALYSIS, ROUTINE W REFLEX MICROSCOPIC
Bilirubin Urine: NEGATIVE
Glucose, UA: NEGATIVE mg/dL
Hgb urine dipstick: NEGATIVE
Ketones, ur: NEGATIVE mg/dL
Leukocytes,Ua: NEGATIVE
Nitrite: NEGATIVE
Protein, ur: NEGATIVE mg/dL
Specific Gravity, Urine: 1.032 — ABNORMAL HIGH (ref 1.005–1.030)
pH: 6 (ref 5.0–8.0)

## 2019-12-17 LAB — CBG MONITORING, ED: Glucose-Capillary: 163 mg/dL — ABNORMAL HIGH (ref 70–99)

## 2019-12-17 LAB — CBC
HCT: 35.9 % — ABNORMAL LOW (ref 39.0–52.0)
Hemoglobin: 11.7 g/dL — ABNORMAL LOW (ref 13.0–17.0)
MCH: 25.5 pg — ABNORMAL LOW (ref 26.0–34.0)
MCHC: 32.6 g/dL (ref 30.0–36.0)
MCV: 78.4 fL — ABNORMAL LOW (ref 80.0–100.0)
Platelets: 227 10*3/uL (ref 150–400)
RBC: 4.58 MIL/uL (ref 4.22–5.81)
RDW: 13.5 % (ref 11.5–15.5)
WBC: 5.5 10*3/uL (ref 4.0–10.5)
nRBC: 0 % (ref 0.0–0.2)

## 2019-12-17 LAB — RAPID URINE DRUG SCREEN, HOSP PERFORMED
Amphetamines: NOT DETECTED
Barbiturates: NOT DETECTED
Benzodiazepines: NOT DETECTED
Cocaine: NOT DETECTED
Opiates: NOT DETECTED
Tetrahydrocannabinol: NOT DETECTED

## 2019-12-17 LAB — PROTIME-INR
INR: 1 (ref 0.8–1.2)
Prothrombin Time: 13.2 seconds (ref 11.4–15.2)

## 2019-12-17 LAB — ETHANOL: Alcohol, Ethyl (B): <10 mg/dL (ref <10)

## 2019-12-17 MED ORDER — ENOXAPARIN SODIUM 40 MG/0.4ML ~~LOC~~ SOLN: 40.0000 mg | SUBCUTANEOUS | Status: DC

## 2019-12-17 MED ORDER — STROKE: EARLY STAGES OF RECOVERY BOOK
Freq: Once | Status: DC
  Filled 2019-12-17: qty 1

## 2019-12-17 MED ORDER — IOHEXOL 350 MG/ML SOLN
100.0000 mL | Freq: Once | INTRAVENOUS | Status: AC | PRN
  Administered 2019-12-17: 100 mL via INTRAVENOUS

## 2019-12-17 MED ORDER — ASPIRIN 81 MG PO CHEW: 324.0000 mg | CHEWABLE_TABLET | Freq: Once | ORAL | Status: DC

## 2019-12-17 MED ORDER — ASPIRIN 300 MG RE SUPP
300.0000 mg | Freq: Once | RECTAL | Status: AC
  Administered 2019-12-17: 300 mg via RECTAL
  Filled 2019-12-17: qty 1

## 2019-12-17 NOTE — ED Notes (Signed)
Called CT for Stat CT per teleneuro

## 2019-12-17 NOTE — ED Notes (Signed)
Pt transported to CT at 2205. Returned at 2228.

## 2019-12-17 NOTE — ED Notes (Signed)
Completed stroke swallow screen. Pt took small sip of water, stopped drinking and began to cough. EDP made aware.

## 2019-12-17 NOTE — Consult Note (Signed)
[facility] Tele[facility]rology Consult Services  Stat Consult  Date of Service:   12/17/2019 21:32:32  Impression:     .  I63.9 - Cerebrovascular accident (CVA), unspecified mechanism (HCC)  Comments/Sign-Out: This is a 64 yo man presenting originally for slurred speech, now with right hemiparesis and aphasia. I do not see obvious large vessel occlusion on CTA. He was outside the treatment window for alteplase. He will need to be admitted for further workup, including brain MRI. Start on aspirin.  CT HEAD: Showed No Acute Hemorrhage or Acute Core Infarct  Metrics: [facility] Notification Time: 12/17/2019 21:31:08 Stamp Time: 12/17/2019 21:32:32 Callback Response Time: 12/17/2019 21:33:51  Our recommendations are outlined below.  Recommendations:     .  Initiate Aspirin 325 MG Daily   Imaging Studies:     .  MRI Head     .  Echocardiogram - Transthoracic Echocardiogram  Therapies:     .  Physical Therapy, Occupational Therapy, Speech Therapy Assessment When Applicable  Other WorkUp:     .  Check CMP  Disposition: Neurology Follow Up Recommended  Sign Out:     .  Discussed with Emergency Department Provider  ----------------------------------------------------------------------------------------------------  Chief Complaint: unable to assess due to aphasia  History of Present Illness: Patient is a 64 year old Male.  This is a 64 yo man with history of HTN who presented earlier this evening for slurred speech and confusion. Per chart review and discussion with Dr. Lynelle Doctor, he was last known normal around 10:00 this morning though no family is present to verify this. When he was first evaluated by Dr. Lynelle Doctor, he was communicative though seemed confused. He was overall oriented and partcipated in exam, moving all extremities. Later, he was not speaking and had obvious right sided weakness, for which we have been consulted. Since his LKN is within 24h, I  asked that he be brought to CT for CTA and perfusion stat. I do not see obvious LVO on CTA. Baseline MRS is thought to be zero.    Past Medical History:     . Hypertension         Examination: BP(184/103), Pulse(60), Blood Glucose(163) 1A: Level of Consciousness - Alert; keenly responsive + 0 1B: Ask Month and Age - 1 Question Right + 1 1C: Blink Eyes & Squeeze Hands - Performs Both Tasks + 0 2: Test Horizontal Extraocular Movements - Normal + 0 3: Test Visual Fields - No Visual Loss + 0 4: Test Facial Palsy (Use Grimace if Obtunded) - Normal symmetry + 0 5A: Test Left Arm Motor Drift - No Drift for 10 Seconds + 0 5B: Test Right Arm Motor Drift - Drift, hits bed + 2 6A: Test Left Leg Motor Drift - No Drift for 5 Seconds + 0 6B: Test Right Leg Motor Drift - Some Effort Against Gravity + 2 7: Test Limb Ataxia (FNF/Heel-Shin) - No Ataxia + 0 8: Test Sensation - Normal; No sensory loss + 0 9: Test Language/Aphasia - Mute/Global Aphasia: No Usable Speech/Auditory Comprehension + 3 10: Test Dysarthria - Mute/Anarthric + 2 11: Test Extinction/Inattention - No abnormality + 0  NIHSS Score: 10    Patient is being evaluated for possible acute neurologic impairment and high probability of imminent or life-threatening deterioration. I spent total of 20 minutes providing care to this patient, including time for face to face visit via telemedicine, review of medical records, imaging studies and discussion of findings with providers, the patient and/or family.   Dr Lequita Halt  Tracee Mccreery   [facility] [phone]  Case [id number]

## 2019-12-17 NOTE — ED Triage Notes (Addendum)
Pt brought in by [facility] with c/o fall in the yard and laid there for about 1 hour. Family found pt and found pt confused, unsteady on his feet, slurred speech and possible left sided facial droop. EMS called Code Stroke in the field. LKW unclear. Apparently family saw him at some point this morning and he was normal, but unsure exactly what time. Attempting to get in touch with family at this time to clarify LKW. Dr. Lynelle Doctor at bedside.

## 2019-12-17 NOTE — ED Notes (Signed)
RN spoke with pt's sister, Chyrl Civatte More and reports that her husband saw pt walking to her house when he fell in her yard. She last saw pt at 1000 and reports he was normal at that time. Dr. Lynelle Doctor updated.

## 2019-12-17 NOTE — H&P (Signed)
History and Physical  Alec Watson ZOX:[id number] DOB: 1955-05-13 DOA: 12/17/2019  Referring physician: Linwood Dibbles, MD PCP: Patient, No Pcp Per  Patient coming from: Home  Chief Complaint: Altered mental status HPI: Alec Watson is a 64 y.o. male with medical history significant for hypertension who presents to the emergency department via EMS. Patient was unable to provide history due to speech difficulty. History was obtained from ED physician and from ED medical record. Per report, family noted patient to be confused and unsteady on his feet, so EMS was activated, last known well time was possibly in the morning, he had falling in the yard and was presumed to be laying there for about an hour prior to being noted by family.  He was said to complain of generalized weakness.  ED Course:  In the emergency department, he was hemodynamically stable, BP was 161/86. Work-up in the ED showed microcytic anemia and hypoalbuminemia, urinalysis was negative for UTI, alcohol level was less than 10. SARS coronavirus 2 was negative. CT angiography head and neck was negative for emergent large vessel occlusion or acute core infarct. A 15cc area of delayed perfusion at the right hospital lobe, right PCA distribution, of uncertain significance which could reflect an area of evolving ischemia was noted. CT of head showed no acute abnormality. Teleneurology was consulted and recommended MRI of head and echocardiogram in the morning. Aspirin 300 mg rectal was given. Hospitalist was asked to admit patient for further evaluation and management.  Review of Systems: Constitutional: Patient had speech difficulty and questions were answered with head nodding (yes) /shaking (no). Negative for chills and fever.  HENT: Negative for ear pain and sore throat.   Eyes: Negative for pain and visual disturbance.  Respiratory: Negative for cough, chest tightness and shortness of breath.   Cardiovascular: Negative for  chest pain and palpitations.  Gastrointestinal: Negative for abdominal pain and vomiting.  Endocrine: Negative for polyphagia and polyuria.  Genitourinary: Negative for decreased urine volume, dysuria Musculoskeletal:  negative for arthralgias and back pain.  Skin: Negative for color change and rash.  Allergic/Immunologic: Negative for immunocompromised state.  Neurological: Positive for right-sided weakness and speech difficulty. Negative for tremors and headaches.  Hematological: Does not bruise/bleed easily.  All other systems reviewed and are negative   Past Medical History:  Diagnosis Date  . Hypertension    Past Surgical History:  Procedure Laterality Date  . NECK SURGERY      Social History:  reports that he has been smoking cigarettes. He has been smoking about 1.00 pack per day. He has never used smokeless tobacco. He reports current alcohol use. He reports that he does not use drugs.   No Known Allergies  No family history on file.   Prior to Admission medications   Not on File    Physical Exam: BP (!) 190/97   Pulse (!) 57   Temp 98.2 F (36.8 C) (Oral)   Resp 17   Ht  (1.702 m)   Wt 67.5 kg   SpO2 95%   BMI 23.31 kg/m   . General: 64 y.o. year-old male well developed well nourished in no acute distress.  Alert and oriented x3. Marland Kitchen HEENT: NCAT, EOMI . Neck: Supple, trachea midline . Cardiovascular: Regular rate and rhythm with no rubs or gallops.  No thyromegaly or JVD noted.  No lower extremity edema. 2/4 pulses in all 4 extremities. Marland Kitchen Respiratory: Clear to auscultation with no wheezes or rales. Good inspiratory effort. Marland Kitchen  Abdomen: Soft nontender nondistended with normal bowel sounds x4 quadrants. . Muskuloskeletal: No cyanosis, clubbing or edema noted bilaterally . Neuro: Left-sided facial droop, slurred with speech difficulty. Right-sided weakness with inability to raise right arm and right leg of the bed. Able to raise left arm and left leg off the  bed with no drift.  . Skin: No ulcerative lesions noted or rashes . Psychiatry: Judgement and insight appear normal. Mood is appropriate for condition and setting          Labs on Admission:  Basic Metabolic Panel: Recent Labs  Lab 12/17/19 1813  NA 138  K 3.8  CL 108  CO2 25  GLUCOSE 108*  BUN 10  CREATININE 0.83  CALCIUM 8.8*   Liver Function Tests: Recent Labs  Lab 12/17/19 1813  AST 14*  ALT 9  ALKPHOS 61  BILITOT 0.4  PROT 6.4*  ALBUMIN 3.3*   No results for input(s): LIPASE, AMYLASE in the last 168 hours. No results for input(s): AMMONIA in the last 168 hours. CBC: Recent Labs  Lab 12/17/19 1813  WBC 5.5  NEUTROABS 3.2  HGB 11.7*  HCT 35.9*  MCV 78.4*  PLT 227   Cardiac Enzymes: No results for input(s): CKTOTAL, CKMB, CKMBINDEX, TROPONINI in the last 168 hours.  BNP (last 3 results) No results for input(s): BNP in the last 8760 hours.  ProBNP (last 3 results) No results for input(s): PROBNP in the last 8760 hours.  CBG: Recent Labs  Lab 12/17/19 1709  GLUCAP 163*    Radiological Exams on Admission: CT Code Stroke CTA Head W/WO contrast  Result Date: 12/17/2019 CLINICAL DATA:  Initial evaluation for possible stroke, slurred speech, left-sided facial droop. EXAM: CT ANGIOGRAPHY HEAD AND NECK CT PERFUSION BRAIN TECHNIQUE: Multidetector CT imaging of the head and neck was performed using the standard protocol during bolus administration of intravenous contrast. Multiplanar CT image reconstructions and MIPs were obtained to evaluate the vascular anatomy. Carotid stenosis measurements (when applicable) are obtained utilizing NASCET criteria, using the distal internal carotid diameter as the denominator. Multiphase CT imaging of the brain was performed following IV bolus contrast injection. Subsequent parametric perfusion maps were calculated using RAPID software. CONTRAST:  OMNIPAQUE IOHEXOL 350 MG/ML SOLN COMPARISON:  Prior head CT from earlier  the same day. FINDINGS: CTA NECK FINDINGS Aortic arch: Visualized aortic arch of normal caliber with normal 3 vessel morphology. Mild atheromatous change seen about the arch and origin of the great vessels without hemodynamically significant stenosis. Right carotid system: Right CCA patent from its origin to the bifurcation without stenosis. Scattered atheromatous change about the right bifurcation/proximal right ICA without hemodynamically significant stenosis. Right ICA patent distally to the skull base without stenosis, dissection or occlusion. Left carotid system: Left CCA patent from its origin to the bifurcation without stenosis. Mild mixed plaque about the left bifurcation/proximal left ICA without hemodynamically significant stenosis. Left ICA patent distally to the skull base without stenosis, dissection or occlusion. Vertebral arteries: Both vertebral arteries arise from the subclavian arteries. Right vertebral artery dominant. Mild atheromatous change at about the origin and proximal aspect of the right vertebral artery with no more than mild stenosis. Vertebral arteries otherwise widely patent without stenosis, dissection or occlusion. Skeleton: No acute osseous abnormality. No discrete or worrisome osseous lesions. Reversal of the normal cervical lordosis with mild to moderate cervical spondylosis at C4-5 through C6-7. Other neck: No other acute soft tissue abnormality within the neck. No mass lesion or adenopathy. Upper chest: Visualized  upper chest demonstrates no acute finding. Emphysematous changes noted within the visualized lungs. Review of the MIP images confirms the above findings CTA HEAD FINDINGS Anterior circulation: Petrous segments widely patent bilaterally. Scattered calcified plaque within the carotid siphons with associated mild to moderate multifocal narrowing, left slightly worse than right. A1 segments widely patent. Normal anterior communicating artery complex. Anterior cerebral  arteries widely patent to their distal aspects without stenosis. No M1 stenosis or occlusion. Normal left MCA bifurcation. Right MCA trifurcations. Distal MCA branches well perfused and symmetric. Posterior circulation: Vertebral arteries patent to the vertebrobasilar junction without stenosis. Both posterior inferior cerebral arteries patent bilaterally. Minimal irregularity at the vertebrobasilar junction noted, favored to reflect mixing artifact. Basilar widely patent distally without stenosis. Superior cerebral arteries patent bilaterally. Both PCAs primarily supplied via the basilar and are well perfused to their distal aspects. Venous sinuses: Patent allowing for timing the contrast bolus. Anatomic variants: None significant.  No intracranial aneurysm. Review of the MIP images confirms the above findings CT Brain Perfusion Findings: ASPECTS: Not calculated on prior head CT. CBF (<30%) Volume: 0mL Perfusion (Tmax>6.0s) volume: 15mL Mismatch Volume: 15mL Infarction Location:Negative CT perfusion for acute core infarct. Apparent 15 cc area of delayed perfusion at the right occipital region, right PCA distribution. No parenchymal changes seen within this region on prior head CT. No right PCA or posterior circulation stenosis to account for this finding. Finding of uncertain significance, but could reflect an area of evolving ischemia. IMPRESSION: CTA HEAD AND NECK IMPRESSION: 1. Negative CTA for emergent large vessel occlusion. 2. Mild-to-moderate atheromatous change about the carotid siphons with associated mild to moderate multifocal narrowing. 3. Additional mild atheromatous change elsewhere about the major arterial vasculature of the head and neck as above. No other hemodynamically significant or correctable stenosis. 4. Aortic Atherosclerosis (ICD10-I70.0) and Emphysema (ICD10-J43.9). CT PERFUSION IMPRESSION: 1. Negative CT perfusion for acute core infarct. 2. 15 cc area of delayed perfusion at the right  occipital lobe, right PCA distribution, of uncertain significance, but could reflect an area of evolving ischemia. Correlation with dedicated MRI could be performed for further evaluation as warranted. Electronically Signed   By: Rise MuBenjamin  McClintock M.D.   On: 12/17/2019 23:08   CT HEAD WO CONTRAST  Result Date: 12/17/2019 CLINICAL DATA:  64 year old who fell while outside at home earlier today. When found by the family, the patient was confused, had an unsteady gait, slurred speech and possible LEFT facial droop. EXAM: CT HEAD WITHOUT CONTRAST TECHNIQUE: Contiguous axial images were obtained from the base of the skull through the vertex without intravenous contrast. COMPARISON:  02/14/2014. FINDINGS: Brain: Moderate cortical and deep atrophy, progressive since 2015. Severe changes of small vessel disease of the white matter diffusely, also progressive. No mass lesion. No midline shift. No acute hemorrhage or hematoma. No extra-axial fluid collections. No evidence of acute infarction. Vascular: Moderate BILATERAL carotid siphon atherosclerosis. No hyperdense vessel. Skull: No skull fracture or other focal osseous abnormality involving the skull. Sinuses/Orbits: Visualized paranasal sinuses, bilateral mastoid air cells and bilateral middle ear cavities well-aerated. Visualized orbits and globes normal in appearance. Other: None. IMPRESSION: 1. No acute intracranial abnormality. 2. Moderate generalized atrophy and severe chronic microvascular ischemic changes of the white matter, progressive since 2015. Electronically Signed   By: Hulan Saashomas  Lawrence M.D.   On: 12/17/2019 17:47   CT Code Stroke CTA Neck W/WO contrast  Result Date: 12/17/2019 CLINICAL DATA:  Initial evaluation for possible stroke, slurred speech, left-sided facial droop. EXAM: CT ANGIOGRAPHY HEAD  AND NECK CT PERFUSION BRAIN TECHNIQUE: Multidetector CT imaging of the head and neck was performed using the standard protocol during bolus  administration of intravenous contrast. Multiplanar CT image reconstructions and MIPs were obtained to evaluate the vascular anatomy. Carotid stenosis measurements (when applicable) are obtained utilizing NASCET criteria, using the distal internal carotid diameter as the denominator. Multiphase CT imaging of the brain was performed following IV bolus contrast injection. Subsequent parametric perfusion maps were calculated using RAPID software. CONTRAST:  OMNIPAQUE IOHEXOL 350 MG/ML SOLN COMPARISON:  Prior head CT from earlier the same day. FINDINGS: CTA NECK FINDINGS Aortic arch: Visualized aortic arch of normal caliber with normal 3 vessel morphology. Mild atheromatous change seen about the arch and origin of the great vessels without hemodynamically significant stenosis. Right carotid system: Right CCA patent from its origin to the bifurcation without stenosis. Scattered atheromatous change about the right bifurcation/proximal right ICA without hemodynamically significant stenosis. Right ICA patent distally to the skull base without stenosis, dissection or occlusion. Left carotid system: Left CCA patent from its origin to the bifurcation without stenosis. Mild mixed plaque about the left bifurcation/proximal left ICA without hemodynamically significant stenosis. Left ICA patent distally to the skull base without stenosis, dissection or occlusion. Vertebral arteries: Both vertebral arteries arise from the subclavian arteries. Right vertebral artery dominant. Mild atheromatous change at about the origin and proximal aspect of the right vertebral artery with no more than mild stenosis. Vertebral arteries otherwise widely patent without stenosis, dissection or occlusion. Skeleton: No acute osseous abnormality. No discrete or worrisome osseous lesions. Reversal of the normal cervical lordosis with mild to moderate cervical spondylosis at C4-5 through C6-7. Other neck: No other acute soft tissue abnormality within  the neck. No mass lesion or adenopathy. Upper chest: Visualized upper chest demonstrates no acute finding. Emphysematous changes noted within the visualized lungs. Review of the MIP images confirms the above findings CTA HEAD FINDINGS Anterior circulation: Petrous segments widely patent bilaterally. Scattered calcified plaque within the carotid siphons with associated mild to moderate multifocal narrowing, left slightly worse than right. A1 segments widely patent. Normal anterior communicating artery complex. Anterior cerebral arteries widely patent to their distal aspects without stenosis. No M1 stenosis or occlusion. Normal left MCA bifurcation. Right MCA trifurcations. Distal MCA branches well perfused and symmetric. Posterior circulation: Vertebral arteries patent to the vertebrobasilar junction without stenosis. Both posterior inferior cerebral arteries patent bilaterally. Minimal irregularity at the vertebrobasilar junction noted, favored to reflect mixing artifact. Basilar widely patent distally without stenosis. Superior cerebral arteries patent bilaterally. Both PCAs primarily supplied via the basilar and are well perfused to their distal aspects. Venous sinuses: Patent allowing for timing the contrast bolus. Anatomic variants: None significant.  No intracranial aneurysm. Review of the MIP images confirms the above findings CT Brain Perfusion Findings: ASPECTS: Not calculated on prior head CT. CBF (<30%) Volume: 66mL Perfusion (Tmax>6.0s) volume: 16mL Mismatch Volume: 91mL Infarction Location:Negative CT perfusion for acute core infarct. Apparent 15 cc area of delayed perfusion at the right occipital region, right PCA distribution. No parenchymal changes seen within this region on prior head CT. No right PCA or posterior circulation stenosis to account for this finding. Finding of uncertain significance, but could reflect an area of evolving ischemia. IMPRESSION: CTA HEAD AND NECK IMPRESSION: 1. Negative CTA  for emergent large vessel occlusion. 2. Mild-to-moderate atheromatous change about the carotid siphons with associated mild to moderate multifocal narrowing. 3. Additional mild atheromatous change elsewhere about the major arterial vasculature of the  head and neck as above. No other hemodynamically significant or correctable stenosis. 4. Aortic Atherosclerosis (ICD10-I70.0) and Emphysema (ICD10-J43.9). CT PERFUSION IMPRESSION: 1. Negative CT perfusion for acute core infarct. 2. 15 cc area of delayed perfusion at the right occipital lobe, right PCA distribution, of uncertain significance, but could reflect an area of evolving ischemia. Correlation with dedicated MRI could be performed for further evaluation as warranted. Electronically Signed   By: Rise Mu M.D.   On: 12/17/2019 23:08   CT Code Stroke Cerebral Perfusion with contrast  Result Date: 12/17/2019 CLINICAL DATA:  Initial evaluation for possible stroke, slurred speech, left-sided facial droop. EXAM: CT ANGIOGRAPHY HEAD AND NECK CT PERFUSION BRAIN TECHNIQUE: Multidetector CT imaging of the head and neck was performed using the standard protocol during bolus administration of intravenous contrast. Multiplanar CT image reconstructions and MIPs were obtained to evaluate the vascular anatomy. Carotid stenosis measurements (when applicable) are obtained utilizing NASCET criteria, using the distal internal carotid diameter as the denominator. Multiphase CT imaging of the brain was performed following IV bolus contrast injection. Subsequent parametric perfusion maps were calculated using RAPID software. CONTRAST:  OMNIPAQUE IOHEXOL 350 MG/ML SOLN COMPARISON:  Prior head CT from earlier the same day. FINDINGS: CTA NECK FINDINGS Aortic arch: Visualized aortic arch of normal caliber with normal 3 vessel morphology. Mild atheromatous change seen about the arch and origin of the great vessels without hemodynamically significant stenosis. Right  carotid system: Right CCA patent from its origin to the bifurcation without stenosis. Scattered atheromatous change about the right bifurcation/proximal right ICA without hemodynamically significant stenosis. Right ICA patent distally to the skull base without stenosis, dissection or occlusion. Left carotid system: Left CCA patent from its origin to the bifurcation without stenosis. Mild mixed plaque about the left bifurcation/proximal left ICA without hemodynamically significant stenosis. Left ICA patent distally to the skull base without stenosis, dissection or occlusion. Vertebral arteries: Both vertebral arteries arise from the subclavian arteries. Right vertebral artery dominant. Mild atheromatous change at about the origin and proximal aspect of the right vertebral artery with no more than mild stenosis. Vertebral arteries otherwise widely patent without stenosis, dissection or occlusion. Skeleton: No acute osseous abnormality. No discrete or worrisome osseous lesions. Reversal of the normal cervical lordosis with mild to moderate cervical spondylosis at C4-5 through C6-7. Other neck: No other acute soft tissue abnormality within the neck. No mass lesion or adenopathy. Upper chest: Visualized upper chest demonstrates no acute finding. Emphysematous changes noted within the visualized lungs. Review of the MIP images confirms the above findings CTA HEAD FINDINGS Anterior circulation: Petrous segments widely patent bilaterally. Scattered calcified plaque within the carotid siphons with associated mild to moderate multifocal narrowing, left slightly worse than right. A1 segments widely patent. Normal anterior communicating artery complex. Anterior cerebral arteries widely patent to their distal aspects without stenosis. No M1 stenosis or occlusion. Normal left MCA bifurcation. Right MCA trifurcations. Distal MCA branches well perfused and symmetric. Posterior circulation: Vertebral arteries patent to the  vertebrobasilar junction without stenosis. Both posterior inferior cerebral arteries patent bilaterally. Minimal irregularity at the vertebrobasilar junction noted, favored to reflect mixing artifact. Basilar widely patent distally without stenosis. Superior cerebral arteries patent bilaterally. Both PCAs primarily supplied via the basilar and are well perfused to their distal aspects. Venous sinuses: Patent allowing for timing the contrast bolus. Anatomic variants: None significant.  No intracranial aneurysm. Review of the MIP images confirms the above findings CT Brain Perfusion Findings: ASPECTS: Not calculated on prior head CT.  CBF (<30%) Volume: 75mL Perfusion (Tmax>6.0s) volume: 54mL Mismatch Volume: 92mL Infarction Location:Negative CT perfusion for acute core infarct. Apparent 15 cc area of delayed perfusion at the right occipital region, right PCA distribution. No parenchymal changes seen within this region on prior head CT. No right PCA or posterior circulation stenosis to account for this finding. Finding of uncertain significance, but could reflect an area of evolving ischemia. IMPRESSION: CTA HEAD AND NECK IMPRESSION: 1. Negative CTA for emergent large vessel occlusion. 2. Mild-to-moderate atheromatous change about the carotid siphons with associated mild to moderate multifocal narrowing. 3. Additional mild atheromatous change elsewhere about the major arterial vasculature of the head and neck as above. No other hemodynamically significant or correctable stenosis. 4. Aortic Atherosclerosis (ICD10-I70.0) and Emphysema (ICD10-J43.9). CT PERFUSION IMPRESSION: 1. Negative CT perfusion for acute core infarct. 2. 15 cc area of delayed perfusion at the right occipital lobe, right PCA distribution, of uncertain significance, but could reflect an area of evolving ischemia. Correlation with dedicated MRI could be performed for further evaluation as warranted. Electronically Signed   By: Rise Mu M.D.    On: 12/17/2019 23:08    EKG: I independently viewed the EKG done and my findings are as followed: Sinus rhythm at rate of 73 bpm  Assessment/Plan Present on Admission: . Acute CVA (cerebrovascular accident) [facility])  Principal Problem:   Acute CVA (cerebrovascular accident) [facility]) Active Problems:   Essential hypertension   Hypoalbuminemia   Microcytic anemia  Acute cerebrovascular accident Patient will be admitted to telemetry unit  CT angiography head and neck was negative for emergent large vessel occlusion or acute core infarct. Echocardiogram in the morning MRI of brain without contrast in the morning Continue aspirin 81mg  po daily once patient is able to tolerate oral intake Continue fall precautions and neuro checks Lipid panel and hemoglobin A1c will be checked Continue PT/ST/OT eval and treat Bedside swallow eval by nursing prior to diet Neurology will be consulted for further recommendation.   Essential hypertension Permissive hypertension will be allowed at this time  Hypoalbuminemia Albumin 3.3, oral supplement will be provided once patient is able to tolerate oral intake  Microcytic anemia Iron studies will be done  DVT prophylaxis: SCDs (no indication for chemoprophylaxis at this time due to CVA)  Code Status: Full  Family Communication: None at bedside  Disposition Plan:  Patient is from:                        home Anticipated DC to:                   SNF or family members home Anticipated DC date:               1 day Anticipated DC barriers:           Patient is now stable for discharge at this time due to acute CVA that requires further stroke work-up and pending neurology as well as PT/OT/ST eval and recommendation   Consults called: Neurology  Admission status: Observation    MD [facility]  If 7PM-7AM, please contact night-coverage [email or web address] Password [email or web address]  12/18/2019, 3:02 AM

## 2019-12-17 NOTE — ED Notes (Signed)
Pt provided with fresh linen d/t urinary incontinence. Male purewick applied.

## 2019-12-17 NOTE — ED Notes (Signed)
Patient transported to CT 

## 2019-12-17 NOTE — ED Provider Notes (Signed)
[facility] EMERGENCY DEPARTMENT Provider Note   CSN: [id number] Arrival date & time: 12/17/19  1705     History Chief Complaint  Patient presents with  . Altered Mental Status    Alec Watson is a 64 y.o. male.  HPI   Patient presented to the ED for evaluation after being found down in his yard. According to the EMS report they were called after the patient was found by his family seeming confused and unsteady on his feet. Patient also seem to have slurred speech and a questionable left-sided facial droop. EMS was called. Last known well is unknown. Possibly sometime in the morning. Apparently when the patient had fallen earlier he was laying in the yard for about an hour. Patient states he just got weak. He denies any pain. He does feel weak all over.  Past Medical History:  Diagnosis Date  . Hypertension     There are no problems to display for this patient.   Past Surgical History:  Procedure Laterality Date  . NECK SURGERY         No family history on file.  Social History   Tobacco Use  . Smoking status: Current Every Day Smoker    Packs/day: 1.00    Types: Cigarettes  . Smokeless tobacco: Never Used  Vaping Use  . Vaping Use: Never used  Substance Use Topics  . Alcohol use: Yes    Comment: 2 16oz beers daily   . Drug use: No    Home Medications Prior to Admission medications   Not on File    Allergies    Patient has no known allergies.  Review of Systems   Review of Systems  All other systems reviewed and are negative.   Physical Exam Updated Vital Signs BP (!) 184/105   Pulse 62   Temp 98.2 F (36.8 C) (Oral)   Resp 12   Ht 1.702 m (5\' 7" )   Wt 67.5 kg   SpO2 99%   BMI 23.31 kg/m   Physical Exam Vitals and nursing note reviewed.  Constitutional:      General: He is not in acute distress.    Appearance: He is well-developed.  HENT:     Head: Normocephalic and atraumatic.     Right Ear: External ear normal.     Left  Ear: External ear normal.  Eyes:     General: No scleral icterus.       Right eye: No discharge.        Left eye: No discharge.     Conjunctiva/sclera: Conjunctivae normal.  Neck:     Trachea: No tracheal deviation.  Cardiovascular:     Rate and Rhythm: Normal rate and regular rhythm.  Pulmonary:     Effort: Pulmonary effort is normal. No respiratory distress.     Breath sounds: Normal breath sounds. No stridor. No wheezing or rales.  Abdominal:     General: Bowel sounds are normal. There is no distension.     Palpations: Abdomen is soft.     Tenderness: There is no abdominal tenderness. There is no guarding or rebound.  Musculoskeletal:        General: No tenderness.     Cervical back: Neck supple.  Skin:    General: Skin is warm and dry.     Findings: No rash.  Neurological:     Mental Status: He is alert and oriented to person, place, and time.     Cranial Nerves: No cranial nerve  deficit (No facial droop, extraocular movements intact, tongue midline  , speech slurred).     Sensory: No sensory deficit.     Motor: No abnormal muscle tone or seizure activity.     Coordination: Coordination normal.     Comments: No pronator drift bilateral upper extrem, able to hold both legs off bed for 5 seconds, sensation intact in all extremities, no visual field cuts, no left or right sided neglect, normal finger-nose exam bilaterally, no nystagmus noted      ED Results / Procedures / Treatments   Labs (all labs ordered are listed, but only abnormal results are displayed) Labs Reviewed  CBC - Abnormal; Notable for the following components:      Result Value   Hemoglobin 11.7 (*)    HCT 35.9 (*)    MCV 78.4 (*)    MCH 25.5 (*)    All other components within normal limits  COMPREHENSIVE METABOLIC PANEL - Abnormal; Notable for the following components:   Glucose, Bld 108 (*)    Calcium 8.8 (*)    Total Protein 6.4 (*)    Albumin 3.3 (*)    AST 14 (*)    All other components within  normal limits  CBG MONITORING, ED - Abnormal; Notable for the following components:   Glucose-Capillary 163 (*)    All other components within normal limits  SARS CORONAVIRUS 2 BY RT PCR (HOSPITAL ORDER, PERFORMED IN [facility])  ETHANOL  PROTIME-INR  APTT  DIFFERENTIAL  RAPID URINE DRUG SCREEN, HOSP PERFORMED  URINALYSIS, ROUTINE W REFLEX MICROSCOPIC    EKG EKG Interpretation  Date/Time:  Sunday December 17 2019 17:12:46 EDT Ventricular Rate:  73 PR Interval:    QRS Duration: 77 QT Interval:  382 QTC Calculation: 421 R Axis:   70 Text Interpretation: Sinus rhythm Biatrial enlargement Probable left ventricular hypertrophy Borderline T abnormalities, lateral leads , new since last tracing Confirmed by Linwood Dibbles [phone]) on 12/17/2019 5:17:56 PM   Radiology CT HEAD WO CONTRAST  Result Date: 12/17/2019 CLINICAL DATA:  64 year old who fell while outside at home earlier today. When found by the family, the patient was confused, had an unsteady gait, slurred speech and possible LEFT facial droop. EXAM: CT HEAD WITHOUT CONTRAST TECHNIQUE: Contiguous axial images were obtained from the base of the skull through the vertex without intravenous contrast. COMPARISON:  02/14/2014. FINDINGS: Brain: Moderate cortical and deep atrophy, progressive since 2015. Severe changes of small vessel disease of the white matter diffusely, also progressive. No mass lesion. No midline shift. No acute hemorrhage or hematoma. No extra-axial fluid collections. No evidence of acute infarction. Vascular: Moderate BILATERAL carotid siphon atherosclerosis. No hyperdense vessel. Skull: No skull fracture or other focal osseous abnormality involving the skull. Sinuses/Orbits: Visualized paranasal sinuses, bilateral mastoid air cells and bilateral middle ear cavities well-aerated. Visualized orbits and globes normal in appearance. Other: None. IMPRESSION: 1. No acute intracranial abnormality. 2. Moderate  generalized atrophy and severe chronic microvascular ischemic changes of the white matter, progressive since 2015. Electronically Signed   By: Hulan Saas M.D.   On: 12/17/2019 17:47    Procedures .Critical Care Performed by: Linwood Dibbles, MD Authorized by: Linwood Dibbles, MD   Critical care provider statement:    Critical care time (minutes):  45   Critical care was time spent personally by me on the following activities:  Discussions with consultants, evaluation of patient's response to treatment, examination of patient, ordering and performing treatments and interventions, ordering and review  of laboratory studies, ordering and review of radiographic studies, pulse oximetry, re-evaluation of patient's condition, obtaining history from patient or surrogate and review of old charts   (including critical care time)  Medications Ordered in ED Medications  aspirin chewable tablet 324 mg (has no administration in time range)  iohexol (OMNIPAQUE) 350 MG/ML injection 100 mL (100 mLs Intravenous Contrast Given 12/17/19 2213)    ED Course  I have reviewed the triage vital signs and the nursing notes.  Pertinent labs & imaging results that were available during my care of the patient were reviewed by me and considered in my medical decision making (see chart for details).  Clinical Course as of Dec 17 2246  Wynelle Link Dec 17, 2019  3790 No acute changes noted on ct   [JK]  2111 Cbc and cmet unremarkable   [JK]  2117 Reassessed pt.  Pt is now not speaking to me, answering any questions.  ?right sided weakness now.   Neuro exam has worsened.  Will consult neurology   [JK]  2128 Case discussed with Dr Iver Nestle.  Will proceed with ct angio and perfusion.  Also recommends tele neurology consult   [JK]  2200 Discussed with Dr Lequita Halt, tele neurology.  May be an intervention candidate based on his CT perfusion and angio studies.  Not a tpa candidate.   BP increasing.  Will continue to monitor   [JK]  2242  CT scans reviewed by tele neuro.  No signs of large vessel occlusion.  Recommends mri in the am, admit stroke workup   [JK]    Clinical Course User Index [JK] Linwood Dibbles, MD   MDM Rules/Calculators/A&P               NIH Stroke Scale: 1           Patient presented to the ED for evaluation for a fall and questionable stroke.  When the patient arrived his last time normal was earlier this morning about 10 AM.  Family found him on the ground outside the home.  On initial exam patient was conversant but did have some slurred speech.  No definite weakness.  Patient was outside [facility] window when he arrived.  LVO screen negative.  Initial CT scan and labs are unremarkable.  On reassessment however the patient is now nonverbal.  He appears to have right-sided weakness.  Clearly appears to have worsening neurologic findings.  I will consult with neurology.  Discussed with Dr Iver Nestle. Will proceed with ct angio and ct perfusioin.  Requests tele neurology consult here.  If candidate for intervention will require transfer.  Pt seen by [facility] neurology as well.  CT scans are not showing lvo. No indication for intervention.  Plan on admission for stroke workup, mri. Final Clinical Impression(s) / ED Diagnoses Final diagnoses:  Cerebrovascular accident (CVA), unspecified mechanism (HCC)      Linwood Dibbles, MD 12/17/19 2249

## 2019-12-18 ENCOUNTER — Encounter (HOSPITAL_COMMUNITY): Payer: Self-pay | Admitting: Internal Medicine

## 2019-12-18 ENCOUNTER — Inpatient Hospital Stay (HOSPITAL_COMMUNITY)
Admit: 2019-12-18 | Discharge: 2019-12-18 | Disposition: A | Discharge status: Home / Self Care | Payer: Medicaid Other | Attending: Neurology | Admitting: Neurology

## 2019-12-18 ENCOUNTER — Observation Stay (HOSPITAL_COMMUNITY): Payer: Medicaid Other

## 2019-12-18 DIAGNOSIS — I10 Essential (primary) hypertension: Secondary | ICD-10-CM | POA: Diagnosis present

## 2019-12-18 DIAGNOSIS — W19XXXA Unspecified fall, initial encounter: Secondary | ICD-10-CM | POA: Diagnosis present

## 2019-12-18 DIAGNOSIS — R2681 Unsteadiness on feet: Secondary | ICD-10-CM | POA: Diagnosis present

## 2019-12-18 DIAGNOSIS — F17210 Nicotine dependence, cigarettes, uncomplicated: Secondary | ICD-10-CM | POA: Diagnosis present

## 2019-12-18 DIAGNOSIS — I639 Cerebral infarction, unspecified: Secondary | ICD-10-CM | POA: Diagnosis present

## 2019-12-18 DIAGNOSIS — R29704 NIHSS score 4: Secondary | ICD-10-CM | POA: Diagnosis not present

## 2019-12-18 DIAGNOSIS — I361 Nonrheumatic tricuspid (valve) insufficiency: Secondary | ICD-10-CM

## 2019-12-18 DIAGNOSIS — R4701 Aphasia: Secondary | ICD-10-CM | POA: Diagnosis present

## 2019-12-18 DIAGNOSIS — G9349 Other encephalopathy: Secondary | ICD-10-CM | POA: Diagnosis present

## 2019-12-18 DIAGNOSIS — G9389 Other specified disorders of brain: Secondary | ICD-10-CM | POA: Diagnosis present

## 2019-12-18 DIAGNOSIS — D509 Iron deficiency anemia, unspecified: Secondary | ICD-10-CM

## 2019-12-18 DIAGNOSIS — I083 Combined rheumatic disorders of mitral, aortic and tricuspid valves: Secondary | ICD-10-CM | POA: Diagnosis present

## 2019-12-18 DIAGNOSIS — G8191 Hemiplegia, unspecified affecting right dominant side: Secondary | ICD-10-CM | POA: Diagnosis present

## 2019-12-18 DIAGNOSIS — F0150 Vascular dementia without behavioral disturbance: Secondary | ICD-10-CM | POA: Diagnosis present

## 2019-12-18 DIAGNOSIS — Y92007 Garden or yard of unspecified non-institutional (private) residence as the place of occurrence of the external cause: Secondary | ICD-10-CM | POA: Diagnosis not present

## 2019-12-18 DIAGNOSIS — E8809 Other disorders of plasma-protein metabolism, not elsewhere classified: Secondary | ICD-10-CM

## 2019-12-18 DIAGNOSIS — R29810 Facial weakness: Secondary | ICD-10-CM | POA: Diagnosis present

## 2019-12-18 DIAGNOSIS — Z20822 Contact with and (suspected) exposure to covid-19: Secondary | ICD-10-CM | POA: Diagnosis present

## 2019-12-18 DIAGNOSIS — R4781 Slurred speech: Secondary | ICD-10-CM | POA: Diagnosis present

## 2019-12-18 DIAGNOSIS — R29707 NIHSS score 7: Secondary | ICD-10-CM | POA: Diagnosis not present

## 2019-12-18 DIAGNOSIS — R471 Dysarthria and anarthria: Secondary | ICD-10-CM | POA: Diagnosis present

## 2019-12-18 DIAGNOSIS — R29706 NIHSS score 6: Secondary | ICD-10-CM | POA: Diagnosis not present

## 2019-12-18 DIAGNOSIS — R29708 NIHSS score 8: Secondary | ICD-10-CM | POA: Diagnosis present

## 2019-12-18 DIAGNOSIS — R4182 Altered mental status, unspecified: Secondary | ICD-10-CM | POA: Diagnosis present

## 2019-12-18 LAB — CBC
HCT: 38.1 % — ABNORMAL LOW (ref 39.0–52.0)
Hemoglobin: 12.3 g/dL — ABNORMAL LOW (ref 13.0–17.0)
MCH: 25.2 pg — ABNORMAL LOW (ref 26.0–34.0)
MCHC: 32.3 g/dL (ref 30.0–36.0)
MCV: 78.1 fL — ABNORMAL LOW (ref 80.0–100.0)
Platelets: 232 10*3/uL (ref 150–400)
RBC: 4.88 MIL/uL (ref 4.22–5.81)
RDW: 13.5 % (ref 11.5–15.5)
WBC: 6.1 10*3/uL (ref 4.0–10.5)
nRBC: 0 % (ref 0.0–0.2)

## 2019-12-18 LAB — IRON AND TIBC
Iron: 116 ug/dL (ref 45–182)
Saturation Ratios: 42 % — ABNORMAL HIGH (ref 17.9–39.5)
TIBC: 277 ug/dL (ref 250–450)
UIBC: 161 ug/dL

## 2019-12-18 LAB — LIPID PANEL
Cholesterol: 181 mg/dL (ref 0–200)
HDL: 57 mg/dL (ref >40)
LDL Cholesterol: 114 mg/dL — ABNORMAL HIGH (ref 0–99)
Total CHOL/HDL Ratio: 3.2 RATIO
Triglycerides: 50 mg/dL (ref <150)
VLDL: 10 mg/dL (ref 0–40)

## 2019-12-18 LAB — ECHOCARDIOGRAM COMPLETE
AR max vel: 2.02 cm2
AV Area VTI: 2.07 cm2
AV Area mean vel: 1.87 cm2
AV Mean grad: 5.3 mmHg
AV Peak grad: 12.2 mmHg
Ao pk vel: 1.75 m/s
Area-P 1/2: 2.39 cm2
Height: 67 in
MV M vel: 6.42 m/s
MV Peak grad: 164.9 mmHg
Radius: 0.7 cm
S' Lateral: 3.41 cm
Weight: 2380.8 oz

## 2019-12-18 LAB — COMPREHENSIVE METABOLIC PANEL
ALT: 11 U/L (ref 0–44)
AST: 14 U/L — ABNORMAL LOW (ref 15–41)
Albumin: 3.3 g/dL — ABNORMAL LOW (ref 3.5–5.0)
Alkaline Phosphatase: 52 U/L (ref 38–126)
Anion gap: 8 (ref 5–15)
BUN: 9 mg/dL (ref 8–23)
CO2: 24 mmol/L (ref 22–32)
Calcium: 9.1 mg/dL (ref 8.9–10.3)
Chloride: 107 mmol/L (ref 98–111)
Creatinine, Ser: 0.81 mg/dL (ref 0.61–1.24)
GFR calc Af Amer: >60 mL/min (ref >60)
GFR calc non Af Amer: >60 mL/min (ref >60)
Glucose, Bld: 95 mg/dL (ref 70–99)
Potassium: 3.9 mmol/L (ref 3.5–5.1)
Sodium: 139 mmol/L (ref 135–145)
Total Bilirubin: 0.7 mg/dL (ref 0.3–1.2)
Total Protein: 6.3 g/dL — ABNORMAL LOW (ref 6.5–8.1)

## 2019-12-18 LAB — APTT: aPTT: 26 seconds (ref 24–36)

## 2019-12-18 LAB — TSH: TSH: 2.497 u[IU]/mL (ref 0.350–4.500)

## 2019-12-18 LAB — VITAMIN B12: Vitamin B-12: 98 pg/mL — ABNORMAL LOW (ref 180–914)

## 2019-12-18 LAB — FERRITIN: Ferritin: 31 ng/mL (ref 24–336)

## 2019-12-18 LAB — PROTIME-INR
INR: 1.1 (ref 0.8–1.2)
Prothrombin Time: 13.4 seconds (ref 11.4–15.2)

## 2019-12-18 LAB — HEMOGLOBIN A1C
Hgb A1c MFr Bld: 5.7 % — ABNORMAL HIGH (ref 4.8–5.6)
Mean Plasma Glucose: 116.89 mg/dL

## 2019-12-18 LAB — PHOSPHORUS: Phosphorus: 3.5 mg/dL (ref 2.5–4.6)

## 2019-12-18 LAB — MAGNESIUM: Magnesium: 2.2 mg/dL (ref 1.7–2.4)

## 2019-12-18 LAB — HIV ANTIBODY (ROUTINE TESTING W REFLEX): HIV Screen 4th Generation wRfx: NONREACTIVE

## 2019-12-18 MED ORDER — HYDRALAZINE HCL 20 MG/ML IJ SOLN
10.0000 mg | INTRAMUSCULAR | Status: DC | PRN
  Administered 2019-12-18 – 2019-12-19 (×2): 10 mg via INTRAVENOUS
  Filled 2019-12-18 (×4): qty 1

## 2019-12-18 MED ORDER — ASPIRIN 81 MG PO CHEW
81.0000 mg | CHEWABLE_TABLET | Freq: Every day | ORAL | Status: DC
  Administered 2019-12-20 – 2019-12-25 (×6): 81 mg via ORAL
  Filled 2019-12-18 (×7): qty 1

## 2019-12-18 MED ORDER — LABETALOL HCL 5 MG/ML IV SOLN
10.0000 mg | Freq: Once | INTRAVENOUS | Status: AC
  Administered 2019-12-18: 10 mg via INTRAVENOUS
  Filled 2019-12-18: qty 4

## 2019-12-18 MED ORDER — SIMVASTATIN 20 MG PO TABS
40.0000 mg | ORAL_TABLET | Freq: Every day | ORAL | Status: DC
  Administered 2019-12-19: 40 mg via ORAL
  Filled 2019-12-18 (×2): qty 2

## 2019-12-18 MED ORDER — HALOPERIDOL LACTATE 5 MG/ML IJ SOLN
5.0000 mg | Freq: Four times a day (QID) | INTRAMUSCULAR | Status: AC | PRN
  Administered 2019-12-18: 5 mg via INTRAMUSCULAR
  Filled 2019-12-18: qty 1

## 2019-12-18 NOTE — Evaluation (Signed)
Physical Therapy Evaluation Patient Details Name: Alec Watson MRN: [id number] DOB: 15-Apr-1955 Today's Date: 12/18/2019   History of Present Illness  Alec Watson is a 64 y.o. male with medical history significant for hypertension who presents to the emergency department via EMS. Patient was unable to provide history due to speech difficulty. History was obtained from ED physician and from ED medical record. Per report, family noted patient to be confused and unsteady on his feet, so EMS was activated, last known well time was possibly in the morning, he had falling in the yard and was presumed to be laying there for about an hour prior to being noted by family.  He was said to complain of generalized weakness.    Clinical Impression  Patient unable to use RUE for bed mobility requiring frequent verbal/tactile cueing to position LUE in order to scoot self forward with fair/poor carryover, able to grip RW with right hand with tactile cueing, limited to a few unsteady labored steps at bedside due to poor coordination, spasticity and weakness of the right side.  Patient tolerated sitting up in chair after therapy with his daughter present in room - RN aware.  Patient will benefit from continued physical therapy in hospital and recommended venue below to increase strength, balance, endurance for safe ADLs and gait.     Follow Up Recommendations SNF    Equipment Recommendations  Rolling walker with 5" wheels    Recommendations for Other Services       Precautions / Restrictions Precautions Precautions: Fall Restrictions Weight Bearing Restrictions: No      Mobility  Bed Mobility Overal bed mobility: Needs Assistance Bed Mobility: Supine to Sit     Supine to sit: Mod assist;Max assist     General bed mobility comments: slow labored movement with limited use of right side  Transfers Overall transfer level: Needs assistance Equipment used: Rolling walker (2  wheeled) Transfers: Sit to/from [employer] Transfers Sit to Stand: Max assist Stand pivot transfers: Max assist       General transfer comment: poor motor control and spasticity of right side,  Ambulation/Gait Ambulation/Gait assistance: Max Chemical engineer (Feet): 3 Feet Assistive device: Rolling walker (2 wheeled) Gait Pattern/deviations: Decreased step length - right;Decreased step length - left;Decreased stance time - right;Decreased stride length Gait velocity: slow   General Gait Details: limited to 3-4 very unsteady labored steps with poor coordination of right side  Stairs            Wheelchair Mobility    Modified Rankin (Stroke Patients Only)       Balance Overall balance assessment: Needs assistance Sitting-balance support: Feet supported;No upper extremity supported Sitting balance-Leahy Scale: Fair Sitting balance - Comments: seated at EOB   Standing balance support: During functional activity;Bilateral upper extremity supported Standing balance-Leahy Scale: Poor Standing balance comment: using RW                             Pertinent Vitals/Pain Pain Assessment: No/denies pain    Home Living       Type of Home: Homeless                Prior Function Level of Independence: Independent with assistive device(s)         Comments: Community ambulator using SPC, does not drive     Hand Dominance   Dominant Hand: Right    Extremity/Trunk Assessment   Upper Extremity Assessment  Upper Extremity Assessment: Defer to OT evaluation    Lower Extremity Assessment Lower Extremity Assessment: Generalized weakness;RLE deficits/detail RLE Deficits / Details: grossly 3/5, limited mostly due to spasticity RLE Sensation: decreased proprioception RLE Coordination: decreased gross motor;decreased fine motor    Cervical / Trunk Assessment Cervical / Trunk Assessment: Normal  Communication   Communication: Expressive  difficulties  Cognition Arousal/Alertness: Awake/alert Behavior During Therapy: WFL for tasks assessed/performed Overall Cognitive Status: Within Functional Limits for tasks assessed                                        General Comments      Exercises     Assessment/Plan    PT Assessment Patient needs continued PT services  PT Problem List Decreased strength;Decreased activity tolerance;Decreased balance;Decreased mobility;Impaired tone       PT Treatment Interventions DME instruction;Gait training;Stair training;Functional mobility training;Therapeutic activities;Therapeutic exercise;Neuromuscular re-education;Balance training;Patient/family education    PT Goals (Current goals can be found in the Care Plan section)  Acute Rehab PT Goals Patient Stated Goal: walk again PT Goal Formulation: With patient/family Time For Goal Achievement: 01/01/20 Potential to Achieve Goals: Good    Frequency Min 5X/week   Barriers to discharge        Co-evaluation               AM-PAC PT "6 Clicks" Mobility  Outcome Measure Help needed turning from your back to your side while in a flat bed without using bedrails?: A Lot Help needed moving from lying on your back to sitting on the side of a flat bed without using bedrails?: A Lot Help needed moving to and from a bed to a chair (including a wheelchair)?: A Lot Help needed standing up from a chair using your arms (e.g., wheelchair or bedside chair)?: A Lot Help needed to walk in hospital room?: A Lot Help needed climbing 3-5 steps with a railing? : Total 6 Click Score: 11    End of Session   Activity Tolerance: Patient tolerated treatment well;Patient limited by fatigue Patient left: in chair;with call bell/phone within reach;with chair alarm set;with family/visitor present Nurse Communication: Mobility status PT Visit Diagnosis: Unsteadiness on feet (R26.81);Other abnormalities of gait and mobility  (R26.89);Muscle weakness (generalized) (M62.81)    Time: 5462-7035 PT Time Calculation (min) (ACUTE ONLY): 30 min   Charges:   PT Evaluation $PT Eval Moderate Complexity: 1 Mod PT Treatments $Therapeutic Activity: 23-37 mins        4:49 PM, 12/18/19 [name], MPT Physical Therapist with [facility] 336 [phone] office [phone] mobile phone

## 2019-12-18 NOTE — Consult Note (Signed)
[facility] [name] A. Alec Pilgrim, MD     [email or web address]          Alec Watson is an 64 y.o. male.   ASSESSMENT/PLAN: 1.  ALTERED MENTAL STATUS LIKELY DUE TO ACUTE ISCHEMIC STROKE/ STROKES.  The patient seems more confused than expected with from his stroke suggesting that there may be underlying encephalopathy at baseline. Given his MRI findings, he likely has some form of vascular dementia. An EEG will be obtained.  I agree with aspirin 81 mg chewable formulation.  The patient's infarcts are in different vascular distribution and this therefore suggest possible cardioembolic phenomena. A 30 day cardiac event monitor is recommended.  Additional recommendation close use of statin medication and blood pressure control. Labs for dementia will be obtained.  2. Likely vascular dementia 3. Chronic nicotine use /nicotine addiction    The patient is a 64 year old black male who presents with the acute onset of confusion. The patient was found by his family on the floor confused and the apparent to have facial asymmetry. The patient was not considered for tPA given the lack of a exact timing of the development of his symptoms. The patient cannot provide a history because of his confusion what appears to be severe dysarthria.  The review systems therefore limited.     GENERAL:  Patient is resting in bed. He seems thin and somewhat under nourished but in no acute distress.  HEENT:  No trauma noted. Neck is supple.  ABDOMEN: Soft  EXTREMITIES: No edema   BACK: Normal alignment.  SKIN: Normal by inspection.    MENTAL STATUS:  He opens his eyes to verbal commands (1). He states the year, month and his age. He he has a rather severe dysarthria(2). He does follow commands. He is able to name 5/5 objects at the bedside. He is severely reduced spontaneous speech however.  CRANIAL NERVES: Pupils are equal, round and reactive to light and accommodation; extraocular movements are  full, there is no significant nystagmus;  There is significant facial asymmetry involving the lower facial muscles (2). The tongue is midline;   MOTOR:  There is significant increased tone involving the right upper extremity and right leg. This is associated with 2/5 weakness (2,2).  Left side shows 5/4 strength.  There is no drift on the left side.  COORDINATION:  There is no dysmetria noted on the left side. There is no tremors are myoclonus noted.  REFLEXES: Deep tendon reflexes are symmetrical and normal.   SENSATION: Normal to light touch and pain   The calculated NIH stroke scale is 9.   Blood pressure (!) 188/92, pulse (!) 49, temperature 98.2 F (36.8 C), temperature source Oral, resp. rate 14, height 5\' 7"  (1.702 m), weight 67.5 kg, SpO2 95 %.  Past Medical History:  Diagnosis Date  . Hypertension     Past Surgical History:  Procedure Laterality Date  . NECK SURGERY      No family history on file.  Social History:  reports that he has been smoking cigarettes. He has been smoking about 1.00 pack per day. He has never used smokeless tobacco. He reports current alcohol use. He reports that he does not use drugs.  Allergies: No Known Allergies  Medications: Prior to Admission medications   Not on File    Scheduled Meds: .  stroke: mapping our early stages of recovery book   Does not apply Once   Continuous Infusions: PRN Meds:.     Results for orders placed  or performed during the hospital encounter of 12/17/19 (from the past 48 hour(s))  CBG monitoring, ED     Status: Abnormal   Collection Time: 12/17/19  5:09 PM  Result Value Ref Range   Glucose-Capillary 163 (H) 70 - 99 mg/dL    Comment: Glucose reference range applies only to samples taken after fasting for at least 8 hours.  SARS Coronavirus 2 by RT PCR (hospital order, performed in [facility] hospital lab) Nasopharyngeal Nasopharyngeal Swab     Status: None   Collection Time: 12/17/19  5:55 PM    Specimen: Nasopharyngeal Swab  Result Value Ref Range   SARS Coronavirus 2 NEGATIVE NEGATIVE    Comment: (NOTE) SARS-CoV-2 target nucleic acids are NOT DETECTED.  The SARS-CoV-2 RNA is generally detectable in upper and lower respiratory specimens during the acute phase of infection. The lowest concentration of SARS-CoV-2 viral copies this assay can detect is 250 copies / mL. A negative result does not preclude SARS-CoV-2 infection and should not be used as the sole basis for treatment or other patient management decisions.  A negative result may occur with improper specimen collection / handling, submission of specimen other than nasopharyngeal swab, presence of viral mutation(s) within the areas targeted by this assay, and inadequate number of viral copies (<250 copies / mL). A negative result must be combined with clinical observations, patient history, and epidemiological information.  Fact Sheet for Patients:   BoilerBrush.com.cy  Fact Sheet for Healthcare Providers: https://pope.com/  This test is not yet approved or  cleared by the [place] FDA and has been authorized for detection and/or diagnosis of SARS-CoV-2 by FDA under an Emergency Use Authorization (EUA).  This EUA will remain in effect (meaning this test can be used) for the duration of the COVID-19 declaration under Section 564(b)(1) of the Act, 21 U.S.C. section 360bbb-3(b)(1), unless the authorization is terminated or revoked sooner.  Performed at [facility], [address] [place], [place] [address]   Ethanol     Status: None   Collection Time: 12/17/19  6:13 PM  Result Value Ref Range   Alcohol, Ethyl (B) <10 <10 mg/dL    Comment: (NOTE) Lowest detectable limit for serum alcohol is 10 mg/dL.  For medical purposes only. Performed at [facility], [address] [place], [place] [address]   Protime-INR     Status: None   Collection Time: 12/17/19  6:13  PM  Result Value Ref Range   Prothrombin Time 13.2 11.4 - 15.2 seconds   INR 1.0 0.8 - 1.2    Comment: (NOTE) INR goal varies based on device and disease states. Performed at [facility], [address], [place], [place] [address]   APTT     Status: None   Collection Time: 12/17/19  6:13 PM  Result Value Ref Range   aPTT 28 24 - 36 seconds    Comment: Performed at [facility], [address] [place], [place] [address]  CBC     Status: Abnormal   Collection Time: 12/17/19  6:13 PM  Result Value Ref Range   WBC 5.5 4.0 - 10.5 K/uL   RBC 4.58 4.22 - 5.81 MIL/uL   Hemoglobin 11.7 (L) 13.0 - 17.0 g/dL   HCT 29.5 (L) 39 - 52 %   MCV 78.4 (L) 80.0 - 100.0 fL   MCH 25.5 (L) 26.0 - 34.0 pg   MCHC 32.6 30.0 - 36.0 g/dL   RDW 62.1 30.8 - 65.7 %   Platelets 227 150 -  400 K/uL   nRBC 0.0 0.0 - 0.2 %    Comment: Performed at [facility], [address] [place], [place] [address]  Differential     Status: None   Collection Time: 12/17/19  6:13 PM  Result Value Ref Range   Neutrophils Relative % 59 %   Neutro Abs 3.2 1.7 - 7.7 K/uL   Lymphocytes Relative 30 %   Lymphs Abs 1.7 0.7 - 4.0 K/uL   Monocytes Relative 9 %   Monocytes Absolute 0.5 0 - 1 K/uL   Eosinophils Relative 2 %   Eosinophils Absolute 0.1 0 - 0 K/uL   Basophils Relative 0 %   Basophils Absolute 0.0 0 - 0 K/uL   Immature Granulocytes 0 %   Abs Immature Granulocytes 0.01 0.00 - 0.07 K/uL    Comment: Performed at [facility], [address], [place], [place] [address]  Comprehensive metabolic panel     Status: Abnormal   Collection Time: 12/17/19  6:13 PM  Result Value Ref Range   Sodium 138 135 - 145 mmol/L   Potassium 3.8 3.5 - 5.1 mmol/L   Chloride 108 98 - 111 mmol/L   CO2 25 22 - 32 mmol/L   Glucose, Bld 108 (H) 70 - 99 mg/dL    Comment: Glucose reference range applies only to samples taken after fasting for at least 8 hours.   BUN 10 8 - 23 mg/dL   Creatinine, Ser 2.54 0.61 - 1.24 mg/dL   Calcium 8.8 (L) 8.9  - 10.3 mg/dL   Total Protein 6.4 (L) 6.5 - 8.1 g/dL   Albumin 3.3 (L) 3.5 - 5.0 g/dL   AST 14 (L) 15 - 41 U/L   ALT 9 0 - 44 U/L   Alkaline Phosphatase 61 38 - 126 U/L   Total Bilirubin 0.4 0.3 - 1.2 mg/dL   GFR calc non Af Amer >60 >60 mL/min   GFR calc Af Amer >60 >60 mL/min   Anion gap 5 5 - 15    Comment: Performed at [facility], [address], [place], [place] [address]  Urine rapid drug screen (hosp performed)not at [facility]     Status: None   Collection Time: 12/17/19 11:15 PM  Result Value Ref Range   Opiates NONE DETECTED NONE DETECTED   Cocaine NONE DETECTED NONE DETECTED   Benzodiazepines NONE DETECTED NONE DETECTED   Amphetamines NONE DETECTED NONE DETECTED   Tetrahydrocannabinol NONE DETECTED NONE DETECTED   Barbiturates NONE DETECTED NONE DETECTED    Comment: (NOTE) DRUG SCREEN FOR MEDICAL PURPOSES ONLY.  IF CONFIRMATION IS NEEDED FOR ANY PURPOSE, NOTIFY LAB WITHIN 5 DAYS.  LOWEST DETECTABLE LIMITS FOR URINE DRUG SCREEN Drug Class                     Cutoff (ng/mL) Amphetamine and metabolites    1000 Barbiturate and metabolites    200 Benzodiazepine                 200 Tricyclics and metabolites     300 Opiates and metabolites        300 Cocaine and metabolites        300 THC                            50 Performed at [facility], [address] [place], [place] [address]   Urinalysis, Routine w reflex microscopic Urine, Clean Catch  Status: Abnormal   Collection Time: 12/17/19 11:15 PM  Result Value Ref Range   Color, Urine STRAW (A) YELLOW   APPearance CLEAR CLEAR   Specific Gravity, Urine 1.032 (H) 1.005 - 1.030   pH 6.0 5.0 - 8.0   Glucose, UA NEGATIVE NEGATIVE mg/dL   Hgb urine dipstick NEGATIVE NEGATIVE   Bilirubin Urine NEGATIVE NEGATIVE   Ketones, ur NEGATIVE NEGATIVE mg/dL   Protein, ur NEGATIVE NEGATIVE mg/dL   Nitrite NEGATIVE NEGATIVE   Leukocytes,Ua NEGATIVE NEGATIVE    Comment: Performed at [facility], [address]  [place], [place] [address]  Lipid panel     Status: Abnormal   Collection Time: 12/18/19  5:38 AM  Result Value Ref Range   Cholesterol 181 0 - 200 mg/dL   Triglycerides 50 <962 mg/dL   HDL 57 >95 mg/dL   Total CHOL/HDL Ratio 3.2 RATIO   VLDL 10 0 - 40 mg/dL   LDL Cholesterol 284 (H) 0 - 99 mg/dL    Comment:        Total Cholesterol/HDL:CHD Risk Coronary Heart Disease Risk Table                     Men   Women  1/2 Average Risk   3.4   3.3  Average Risk       5.0   4.4  2 X Average Risk   9.6   7.1  3 X Average Risk  23.4   11.0        Use the calculated Patient Ratio above and the CHD Risk Table to determine the patient's CHD Risk.        ATP III CLASSIFICATION (LDL):  <100     mg/dL   Optimal  132-440  mg/dL   Near or Above                    Optimal  130-159  mg/dL   Borderline  102-725  mg/dL   High  >366     mg/dL   Very High Performed at [facility], [address], [place], [place] [address]   Comprehensive metabolic panel     Status: Abnormal   Collection Time: 12/18/19  5:38 AM  Result Value Ref Range   Sodium 139 135 - 145 mmol/L   Potassium 3.9 3.5 - 5.1 mmol/L   Chloride 107 98 - 111 mmol/L   CO2 24 22 - 32 mmol/L   Glucose, Bld 95 70 - 99 mg/dL    Comment: Glucose reference range applies only to samples taken after fasting for at least 8 hours.   BUN 9 8 - 23 mg/dL   Creatinine, Ser 7.42 0.61 - 1.24 mg/dL   Calcium 9.1 8.9 - 59.5 mg/dL   Total Protein 6.3 (L) 6.5 - 8.1 g/dL   Albumin 3.3 (L) 3.5 - 5.0 g/dL   AST 14 (L) 15 - 41 U/L   ALT 11 0 - 44 U/L   Alkaline Phosphatase 52 38 - 126 U/L   Total Bilirubin 0.7 0.3 - 1.2 mg/dL   GFR calc non Af Amer >60 >60 mL/min   GFR calc Af Amer >60 >60 mL/min   Anion gap 8 5 - 15    Comment: Performed at [facility], [address] [place], [place] [address]  CBC     Status: Abnormal   Collection Time: 12/18/19  5:38 AM  Result Value Ref Range   WBC 6.1 4.0 - 10.5  K/uL   RBC 4.88 4.22 - 5.81 MIL/uL   Hemoglobin  12.3 (L) 13.0 - 17.0 g/dL   HCT 16.138.1 (L) 39 - 52 %   MCV 78.1 (L) 80.0 - 100.0 fL   MCH 25.2 (L) 26.0 - 34.0 pg   MCHC 32.3 30.0 - 36.0 g/dL   RDW 09.613.5 04.511.5 - 40.915.5 %   Platelets 232 150 - 400 K/uL   nRBC 0.0 0.0 - 0.2 %    Comment: Performed at [facility], [address] [place], [place] [address]  Protime-INR     Status: None   Collection Time: 12/18/19  5:38 AM  Result Value Ref Range   Prothrombin Time 13.4 11.4 - 15.2 seconds   INR 1.1 0.8 - 1.2    Comment: (NOTE) INR goal varies based on device and disease states. Performed at [facility], [address] [place], [place] [address]   APTT     Status: None   Collection Time: 12/18/19  5:38 AM  Result Value Ref Range   aPTT 26 24 - 36 seconds    Comment: Performed at [facility], [address] [place], [place] [address]  Magnesium     Status: None   Collection Time: 12/18/19  5:38 AM  Result Value Ref Range   Magnesium 2.2 1.7 - 2.4 mg/dL    Comment: Performed at [facility], [address] [place], [place] [address]  Phosphorus     Status: None   Collection Time: 12/18/19  5:38 AM  Result Value Ref Range   Phosphorus 3.5 2.5 - 4.6 mg/dL    Comment: Performed at [facility], [address] [place], [place] [address]  Iron and TIBC     Status: Abnormal   Collection Time: 12/18/19  5:38 AM  Result Value Ref Range   Iron 116 45 - 182 ug/dL   TIBC 295277 284250 - 132450 ug/dL   Saturation Ratios 42 (H) 17.9 - 39.5 %   UIBC 161 ug/dL    Comment: Performed at [facility], [address] [place], [place] [address]  Ferritin     Status: None   Collection Time: 12/18/19  5:38 AM  Result Value Ref Range   Ferritin 31 24 - 336 ng/mL    Comment: Performed at [facility], [address] [place], [place] [address]    Studies/Results:  HEAD NECK CTA PERFUSION CT PERFUSION BRAIN  TECHNIQUE: Multidetector CT imaging of the head and neck was performed using the standard protocol during bolus administration of  intravenous contrast. Multiplanar CT image reconstructions and MIPs were obtained to evaluate the vascular anatomy. Carotid stenosis measurements (when applicable) are obtained utilizing NASCET criteria, using the distal internal carotid diameter as the denominator.  Multiphase CT imaging of the brain was performed following IV bolus contrast injection. Subsequent parametric perfusion maps were calculated using RAPID software.  CONTRAST:  100mL OMNIPAQUE IOHEXOL 350 MG/ML SOLN  COMPARISON:  Prior head CT from earlier the same day.  FINDINGS: CTA NECK FINDINGS  Aortic arch: Visualized aortic arch of normal caliber with normal 3 vessel morphology. Mild atheromatous change seen about the arch and origin of the great vessels without hemodynamically significant stenosis.  Right carotid system: Right CCA patent from its origin to the bifurcation without stenosis. Scattered atheromatous change about the right bifurcation/proximal right ICA without hemodynamically significant stenosis. Right ICA patent distally to the skull base without stenosis, dissection or occlusion.  Left carotid system: Left CCA patent from its origin to the  bifurcation without stenosis. Mild mixed plaque about the left bifurcation/proximal left ICA without hemodynamically significant stenosis. Left ICA patent distally to the skull base without stenosis, dissection or occlusion.  Vertebral arteries: Both vertebral arteries arise from the subclavian arteries. Right vertebral artery dominant. Mild atheromatous change at about the origin and proximal aspect of the right vertebral artery with no more than mild stenosis. Vertebral arteries otherwise widely patent without stenosis, dissection or occlusion.  Skeleton: No acute osseous abnormality. No discrete or worrisome osseous lesions. Reversal of the normal cervical lordosis with mild to moderate cervical spondylosis at C4-5 through C6-7.  Other  neck: No other acute soft tissue abnormality within the neck. No mass lesion or adenopathy.  Upper chest: Visualized upper chest demonstrates no acute finding. Emphysematous changes noted within the visualized lungs.  Review of the MIP images confirms the above findings  CTA HEAD FINDINGS  Anterior circulation: Petrous segments widely patent bilaterally. Scattered calcified plaque within the carotid siphons with associated mild to moderate multifocal narrowing, left slightly worse than right. A1 segments widely patent. Normal anterior communicating artery complex. Anterior cerebral arteries widely patent to their distal aspects without stenosis. No M1 stenosis or occlusion. Normal left MCA bifurcation. Right MCA trifurcations. Distal MCA branches well perfused and symmetric.  Posterior circulation: Vertebral arteries patent to the vertebrobasilar junction without stenosis. Both posterior inferior cerebral arteries patent bilaterally. Minimal irregularity at the vertebrobasilar junction noted, favored to reflect mixing artifact. Basilar widely patent distally without stenosis. Superior cerebral arteries patent bilaterally. Both PCAs primarily supplied via the basilar and are well perfused to their distal aspects.  Venous sinuses: Patent allowing for timing the contrast bolus.  Anatomic variants: None significant.  No intracranial aneurysm.  Review of the MIP images confirms the above findings  CT Brain Perfusion Findings:  ASPECTS: Not calculated on prior head CT.  CBF (<30%) Volume: 73mL  Perfusion (Tmax>6.0s) volume: 64mL  Mismatch Volume: 45mL  Infarction Location:Negative CT perfusion for acute core infarct. Apparent 15 cc area of delayed perfusion at the right occipital region, right PCA distribution. No parenchymal changes seen within this region on prior head CT. No right PCA or posterior circulation stenosis to account for this finding. Finding of  uncertain significance, but could reflect an area of evolving ischemia.  IMPRESSION: CTA HEAD AND NECK IMPRESSION:  1. Negative CTA for emergent large vessel occlusion. 2. Mild-to-moderate atheromatous change about the carotid siphons with associated mild to moderate multifocal narrowing. 3. Additional mild atheromatous change elsewhere about the major arterial vasculature of the head and neck as above. No other hemodynamically significant or correctable stenosis. 4. Aortic Atherosclerosis (ICD10-I70.0) and Emphysema (ICD10-J43.9).  CT PERFUSION IMPRESSION:  1. Negative CT perfusion for acute core infarct. 2. 15 cc area of delayed perfusion at the right occipital lobe, right PCA distribution, of uncertain significance, but could reflect an area of evolving ischemia. Correlation with dedicated MRI could be performed for further evaluation as warranted.   MRI BRAIN FINDINGS: The coronal T2 sequence is severely motion degraded, and there is milder motion on other sequences.  Brain: There is a 1 cm acute infarct in the left paramedian pons, and there is an acute 4 mm infarct in the cerebellar vermis. Additional subcentimeter acute to early subacute infarcts are questioned in the superior aspect of the left cerebellar hemisphere, right occipital lobe, and left temporo-occipital junction. 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  Patchy T2 hyperintensities in  the cerebral white matter bilaterally and in the pons are nonspecific but compatible with extensive chronic small vessel ischemic disease. Small chronic infarcts are noted in the right middle cerebellar peduncle, pons, thalami, basal ganglia, and right corona radiata. There is mild cerebral atrophy. No mass, midline shift, or extra-axial fluid collection is identified.  Vascular: Major intracranial vascular flow voids are  preserved.  Skull and upper cervical spine: Unremarkable bone marrow signal.  Sinuses/Orbits: Unremarkable orbits. Minimal mucosal thickening in the right maxillary sinus. Clear mastoid air cells.  Other: None.  IMPRESSION: 1. Small acute posterior circulation infarcts most notable in the pons. 2. Extensive chronic small vessel ischemic disease with multiple chronic infarcts. 3. Numerous chronic microhemorrhages likely related to chronic hypertension.      The brain MRI is reviewed in person in shows faint increased signal on DWI involving the left pontine take mental area, however most and medial temporal lobe all all on the left side. There is also suggestion of increased signal also on DWI involving the medial right temple region. The combination of findings is this embolic phenomena. There is severe confluent periventricular white matter iron cephalopathy. There are multiple areas of small encephalomalacia involving the thalami and basal ganglia bilaterally and also corona radiata. The largest involves the right corona radiata. These areas are often associated with the reduced signal suggesting microhemorrhage. Most of the findings involve the basal ganglia and thalami suggestive more of complications of a hypertension rather than amyloid angiopathy.   Hildur Bayer A. DoonquGerilyn Watson.D.  Diplomate, Biomedical engineer of Psychiatry and Neurology ( Neurology). 12/18/2019, 7:54 AM

## 2019-12-18 NOTE — Progress Notes (Signed)
*  PRELIMINARY RESULTS* Echocardiogram 2D Echocardiogram has been performed.  Stacey Drain 12/18/2019, 10:20 AM

## 2019-12-18 NOTE — Plan of Care (Signed)
  Problem: Acute Rehab PT Goals(only PT should resolve) Goal: Pt Will Go Supine/Side To Sit Outcome: Progressing Flowsheets (Taken 12/18/2019 1650) Pt will go Supine/Side to Sit:  with minimal assist  with moderate assist Goal: Patient Will Transfer Sit To/From Stand Outcome: Progressing Flowsheets (Taken 12/18/2019 1650) Patient will transfer sit to/from stand:  with minimal assist  with moderate assist Goal: Pt Will Transfer Bed To Chair/Chair To Bed Outcome: Progressing Flowsheets (Taken 12/18/2019 1650) Pt will Transfer Bed to Chair/Chair to Bed:  with min assist  with mod assist Goal: Pt Will Ambulate Outcome: Progressing Flowsheets (Taken 12/18/2019 1650) Pt will Ambulate:  50 feet  with minimal assist  with moderate assist  with rolling walker   4:50 PM, 12/18/19 [name], MPT Physical Therapist with [facility] 336 [phone] office [phone] mobile phone

## 2019-12-18 NOTE — Progress Notes (Signed)
PROGRESS NOTE    Alec Watson  YWV:[id number] DOB: 11/06/1955 DOA: 12/17/2019 PCP: Patient, No Pcp Per   Brief Narrative:  Per HPI: Alec Watson is a 64 y.o. male with medical history significant for hypertension who presents to the emergency department via EMS. Patient was unable to provide history due to speech difficulty. History was obtained from ED physician and from ED medical record. Per report, family noted patient to be confused and unsteady on his feet, so EMS was activated, last known well time was possibly in the morning, he had falling in the yard and was presumed to be laying there for about an hour prior to being noted by family.  He was said to complain of generalized weakness.  9/13: Patient admitted with speech difficulty, generalized weakness and unsteadiness with concern for CVA.  Brain MRI with acute CVA to pons region noted with 2D echocardiogram as well as neurology on consultation ordered and pending.  PT evaluation pending, but patient may very likely require SNF as he appears to have significant deficits.  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 -Albumin 3.3  Microcytic anemia -Iron studies within normal limits -Continue to follow CBC   DVT prophylaxis: SCDs Code  Status: Full Family Communication: Spoke with nephew on phone 9/13 Disposition Plan:  Status is: Observation  The patient will require care spanning > 2 midnights and should be moved to inpatient because: Inpatient level of care appropriate due to severity of illness  Dispo: The patient is from: Home              Anticipated d/c is to: SNF              Anticipated d/c date is: 1 day              Patient currently is not medically stable to d/c.  Patient requires further work-up for his CVA and neurology evaluation.  Likely placement to SNF/rehab required given significant deficits.  Consultants:   Neurology  Procedures:   See below  Antimicrobials:   None   Subjective: Patient seen and evaluated today with no events or concerns noted overnight.  He is quite nonverbal and continues to have significant speech deficits.  Objective: Vitals:   12/18/19 0554 12/18/19 0600 12/18/19 0630 12/18/19 0734  BP: (!) 174/95 (!) 193/84 (!) 179/92 (!) 188/92  Pulse: (!) 44 (!) 43 (!) 42 (!) 49  Resp: 17 17 14 14   Temp:      TempSrc:      SpO2: 95% 97% 95%   Weight:      Height:       No intake or output data in the 24 hours ending 12/18/19 0741 Filed Weights   12/17/19 1722  Weight: 67.5 kg    Examination:  General exam: Appears calm and comfortable  Respiratory system: Clear to auscultation. Respiratory effort normal.  Currently on room air. Cardiovascular  system: S1 & S2 heard, RRR.  Gastrointestinal system: Abdomen is nondistended, soft and nontender.  Central nervous system: Awake Extremities: No edema Skin: No rashes, lesions or ulcers Psychiatry: Cannot be assessed    Data Reviewed: I have personally reviewed following labs and imaging studies  CBC: Recent Labs  Lab 12/17/19 1813 12/18/19 0538  WBC 5.5 6.1  NEUTROABS 3.2  --   HGB 11.7* 12.3*  HCT 35.9* 38.1*  MCV 78.4* 78.1*  PLT 227 232   Basic Metabolic Panel: Recent Labs  Lab 12/17/19 1813  12/18/19 0538  NA 138 139  K 3.8 3.9  CL 108 107  CO2 25 24  GLUCOSE 108* 95  BUN 10 9  CREATININE 0.83 0.81  CALCIUM 8.8* 9.1  MG  --  2.2  PHOS  --  3.5   GFR: Estimated Creatinine Clearance: 86.1 mL/min (by C-G formula based on SCr of 0.81 mg/dL). Liver Function Tests: Recent Labs  Lab 12/17/19 1813 12/18/19 0538  AST 14* 14*  ALT 9 11  ALKPHOS 61 52  BILITOT 0.4 0.7  PROT 6.4* 6.3*  ALBUMIN 3.3* 3.3*   No results for input(s): LIPASE, AMYLASE in the last 168 hours. No results for input(s): AMMONIA in the last 168 hours. Coagulation Profile: Recent Labs  Lab 12/17/19 1813 12/18/19 0538  INR 1.0 1.1   Cardiac Enzymes: No results for input(s): CKTOTAL, CKMB, CKMBINDEX, TROPONINI in the last 168 hours. BNP (last 3 results) No results for input(s): PROBNP in the last 8760 hours. HbA1C: No results for input(s): HGBA1C in the last 72 hours. CBG: Recent Labs  Lab 12/17/19 1709  GLUCAP 163*   Lipid Profile: Recent Labs    12/18/19 0538  CHOL 181  HDL 57  LDLCALC 114*  TRIG 50  CHOLHDL 3.2   Thyroid Function Tests: No results for input(s): TSH, T4TOTAL, FREET4, T3FREE, THYROIDAB in the last 72 hours. Anemia Panel: Recent Labs    12/18/19 0538  FERRITIN 31  TIBC 277  IRON 116   Sepsis Labs: No results for input(s): PROCALCITON, LATICACIDVEN in the last 168 hours.  Recent Results (from the past 240 hour(s))  SARS Coronavirus 2 by RT PCR (hospital order, performed in [facility] hospital lab) Nasopharyngeal Nasopharyngeal Swab     Status: None   Collection Time: 12/17/19  5:55 PM   Specimen: Nasopharyngeal Swab  Result Value Ref Range Status   SARS Coronavirus 2 NEGATIVE NEGATIVE Final    Comment: (NOTE) SARS-CoV-2 target nucleic acids are NOT DETECTED.  The SARS-CoV-2 RNA is generally detectable in upper and lower respiratory specimens during the acute phase of infection. The lowest concentration of SARS-CoV-2 viral copies this assay can  detect is 250 copies / mL. A negative result does not preclude SARS-CoV-2 infection and should not be used as the sole basis for treatment or other patient management decisions.  A negative result may occur with improper specimen collection / handling, submission of specimen other than nasopharyngeal swab, presence of viral mutation(s) within the areas targeted by this assay, and inadequate number of viral copies (<250 copies / mL). A negative result must be combined with clinical observations, patient history, and epidemiological information.  Fact Sheet for Patients:   BoilerBrush.com.cy  Fact Sheet for Healthcare Providers: https://pope.com/  This test is not yet approved or  cleared by the [place] FDA and has been authorized for detection and/or diagnosis of SARS-CoV-2 by FDA under an Emergency Use Authorization (EUA).  This EUA will remain in effect (  meaning this test can be used) for the duration of the COVID-19 declaration under Section 564(b)(1) of the Act, 21 U.S.C. section 360bbb-3(b)(1), unless the authorization is terminated or revoked sooner.  Performed at [facility], [address] [place], [place] [address]          Radiology Studies: CT Code Stroke CTA Head W/WO contrast  Result Date: 12/17/2019 CLINICAL DATA:  Initial evaluation for possible stroke, slurred speech, left-sided facial droop. EXAM: CT ANGIOGRAPHY HEAD AND NECK CT PERFUSION BRAIN TECHNIQUE: Multidetector CT imaging of the head and neck was performed using the standard protocol during bolus administration of intravenous contrast. Multiplanar CT image reconstructions and MIPs were obtained to evaluate the vascular anatomy. Carotid stenosis measurements (when applicable) are obtained utilizing NASCET criteria, using the distal internal carotid diameter as the denominator. Multiphase CT imaging of the brain was performed following IV bolus contrast  injection. Subsequent parametric perfusion maps were calculated using RAPID software. CONTRAST:  OMNIPAQUE IOHEXOL 350 MG/ML SOLN COMPARISON:  Prior head CT from earlier the same day. FINDINGS: CTA NECK FINDINGS Aortic arch: Visualized aortic arch of normal caliber with normal 3 vessel morphology. Mild atheromatous change seen about the arch and origin of the great vessels without hemodynamically significant stenosis. Right carotid system: Right CCA patent from its origin to the bifurcation without stenosis. Scattered atheromatous change about the right bifurcation/proximal right ICA without hemodynamically significant stenosis. Right ICA patent distally to the skull base without stenosis, dissection or occlusion. Left carotid system: Left CCA patent from its origin to the bifurcation without stenosis. Mild mixed plaque about the left bifurcation/proximal left ICA without hemodynamically significant stenosis. Left ICA patent distally to the skull base without stenosis, dissection or occlusion. Vertebral arteries: Both vertebral arteries arise from the subclavian arteries. Right vertebral artery dominant. Mild atheromatous change at about the origin and proximal aspect of the right vertebral artery with no more than mild stenosis. Vertebral arteries otherwise widely patent without stenosis, dissection or occlusion. Skeleton: No acute osseous abnormality. No discrete or worrisome osseous lesions. Reversal of the normal cervical lordosis with mild to moderate cervical spondylosis at C4-5 through C6-7. Other neck: No other acute soft tissue abnormality within the neck. No mass lesion or adenopathy. Upper chest: Visualized upper chest demonstrates no acute finding. Emphysematous changes noted within the visualized lungs. Review of the MIP images confirms the above findings CTA HEAD FINDINGS Anterior circulation: Petrous segments widely patent bilaterally. Scattered calcified plaque within the carotid siphons with  associated mild to moderate multifocal narrowing, left slightly worse than right. A1 segments widely patent. Normal anterior communicating artery complex. Anterior cerebral arteries widely patent to their distal aspects without stenosis. No M1 stenosis or occlusion. Normal left MCA bifurcation. Right MCA trifurcations. Distal MCA branches well perfused and symmetric. Posterior circulation: Vertebral arteries patent to the vertebrobasilar junction without stenosis. Both posterior inferior cerebral arteries patent bilaterally. Minimal irregularity at the vertebrobasilar junction noted, favored to reflect mixing artifact. Basilar widely patent distally without stenosis. Superior cerebral arteries patent bilaterally. Both PCAs primarily supplied via the basilar and are well perfused to their distal aspects. Venous sinuses: Patent allowing for timing the contrast bolus. Anatomic variants: None significant.  No intracranial aneurysm. Review of the MIP images confirms the above findings CT Brain Perfusion Findings: ASPECTS: Not calculated on prior head CT. CBF (<30%) Volume: 69mL Perfusion (Tmax>6.0s) volume: 77mL Mismatch Volume: 42mL Infarction Location:Negative CT perfusion for acute core infarct. Apparent 15 cc area of delayed perfusion at the right occipital  region, right PCA distribution. No parenchymal changes seen within this region on prior head CT. No right PCA or posterior circulation stenosis to account for this finding. Finding of uncertain significance, but could reflect an area of evolving ischemia. IMPRESSION: CTA HEAD AND NECK IMPRESSION: 1. Negative CTA for emergent large vessel occlusion. 2. Mild-to-moderate atheromatous change about the carotid siphons with associated mild to moderate multifocal narrowing. 3. Additional mild atheromatous change elsewhere about the major arterial vasculature of the head and neck as above. No other hemodynamically significant or correctable stenosis. 4. Aortic  Atherosclerosis (ICD10-I70.0) and Emphysema (ICD10-J43.9). CT PERFUSION IMPRESSION: 1. Negative CT perfusion for acute core infarct. 2. 15 cc area of delayed perfusion at the right occipital lobe, right PCA distribution, of uncertain significance, but could reflect an area of evolving ischemia. Correlation with dedicated MRI could be performed for further evaluation as warranted. Electronically Signed   By: Rise Mu M.D.   On: 12/17/2019 23:08   CT HEAD WO CONTRAST  Result Date: 12/17/2019 CLINICAL DATA:  64 year old who fell while outside at home earlier today. When found by the family, the patient was confused, had an unsteady gait, slurred speech and possible LEFT facial droop. EXAM: CT HEAD WITHOUT CONTRAST TECHNIQUE: Contiguous axial images were obtained from the base of the skull through the vertex without intravenous contrast. COMPARISON:  02/14/2014. FINDINGS: Brain: Moderate cortical and deep atrophy, progressive since 2015. Severe changes of small vessel disease of the white matter diffusely, also progressive. No mass lesion. No midline shift. No acute hemorrhage or hematoma. No extra-axial fluid collections. No evidence of acute infarction. Vascular: Moderate BILATERAL carotid siphon atherosclerosis. No hyperdense vessel. Skull: No skull fracture or other focal osseous abnormality involving the skull. Sinuses/Orbits: Visualized paranasal sinuses, bilateral mastoid air cells and bilateral middle ear cavities well-aerated. Visualized orbits and globes normal in appearance. Other: None. IMPRESSION: 1. No acute intracranial abnormality. 2. Moderate generalized atrophy and severe chronic microvascular ischemic changes of the white matter, progressive since 2015. Electronically Signed   By: Hulan Saas M.D.   On: 12/17/2019 17:47   CT Code Stroke CTA Neck W/WO contrast  Result Date: 12/17/2019 CLINICAL DATA:  Initial evaluation for possible stroke, slurred speech, left-sided facial  droop. EXAM: CT ANGIOGRAPHY HEAD AND NECK CT PERFUSION BRAIN TECHNIQUE: Multidetector CT imaging of the head and neck was performed using the standard protocol during bolus administration of intravenous contrast. Multiplanar CT image reconstructions and MIPs were obtained to evaluate the vascular anatomy. Carotid stenosis measurements (when applicable) are obtained utilizing NASCET criteria, using the distal internal carotid diameter as the denominator. Multiphase CT imaging of the brain was performed following IV bolus contrast injection. Subsequent parametric perfusion maps were calculated using RAPID software. CONTRAST:  OMNIPAQUE IOHEXOL 350 MG/ML SOLN COMPARISON:  Prior head CT from earlier the same day. FINDINGS: CTA NECK FINDINGS Aortic arch: Visualized aortic arch of normal caliber with normal 3 vessel morphology. Mild atheromatous change seen about the arch and origin of the great vessels without hemodynamically significant stenosis. Right carotid system: Right CCA patent from its origin to the bifurcation without stenosis. Scattered atheromatous change about the right bifurcation/proximal right ICA without hemodynamically significant stenosis. Right ICA patent distally to the skull base without stenosis, dissection or occlusion. Left carotid system: Left CCA patent from its origin to the bifurcation without stenosis. Mild mixed plaque about the left bifurcation/proximal left ICA without hemodynamically significant stenosis. Left ICA patent distally to the skull base without stenosis, dissection or occlusion. Vertebral  arteries: Both vertebral arteries arise from the subclavian arteries. Right vertebral artery dominant. Mild atheromatous change at about the origin and proximal aspect of the right vertebral artery with no more than mild stenosis. Vertebral arteries otherwise widely patent without stenosis, dissection or occlusion. Skeleton: No acute osseous abnormality. No discrete or worrisome osseous  lesions. Reversal of the normal cervical lordosis with mild to moderate cervical spondylosis at C4-5 through C6-7. Other neck: No other acute soft tissue abnormality within the neck. No mass lesion or adenopathy. Upper chest: Visualized upper chest demonstrates no acute finding. Emphysematous changes noted within the visualized lungs. Review of the MIP images confirms the above findings CTA HEAD FINDINGS Anterior circulation: Petrous segments widely patent bilaterally. Scattered calcified plaque within the carotid siphons with associated mild to moderate multifocal narrowing, left slightly worse than right. A1 segments widely patent. Normal anterior communicating artery complex. Anterior cerebral arteries widely patent to their distal aspects without stenosis. No M1 stenosis or occlusion. Normal left MCA bifurcation. Right MCA trifurcations. Distal MCA branches well perfused and symmetric. Posterior circulation: Vertebral arteries patent to the vertebrobasilar junction without stenosis. Both posterior inferior cerebral arteries patent bilaterally. Minimal irregularity at the vertebrobasilar junction noted, favored to reflect mixing artifact. Basilar widely patent distally without stenosis. Superior cerebral arteries patent bilaterally. Both PCAs primarily supplied via the basilar and are well perfused to their distal aspects. Venous sinuses: Patent allowing for timing the contrast bolus. Anatomic variants: None significant.  No intracranial aneurysm. Review of the MIP images confirms the above findings CT Brain Perfusion Findings: ASPECTS: Not calculated on prior head CT. CBF (<30%) Volume: 0mL Perfusion (Tmax>6.0s) volume: 15mL Mismatch Volume: 15mL Infarction Location:Negative CT perfusion for acute core infarct. Apparent 15 cc area of delayed perfusion at the right occipital region, right PCA distribution. No parenchymal changes seen within this region on prior head CT. No right PCA or posterior circulation  stenosis to account for this finding. Finding of uncertain significance, but could reflect an area of evolving ischemia. IMPRESSION: CTA HEAD AND NECK IMPRESSION: 1. Negative CTA for emergent large vessel occlusion. 2. Mild-to-moderate atheromatous change about the carotid siphons with associated mild to moderate multifocal narrowing. 3. Additional mild atheromatous change elsewhere about the major arterial vasculature of the head and neck as above. No other hemodynamically significant or correctable stenosis. 4. Aortic Atherosclerosis (ICD10-I70.0) and Emphysema (ICD10-J43.9). CT PERFUSION IMPRESSION: 1. Negative CT perfusion for acute core infarct. 2. 15 cc area of delayed perfusion at the right occipital lobe, right PCA distribution, of uncertain significance, but could reflect an area of evolving ischemia. Correlation with dedicated MRI could be performed for further evaluation as warranted. Electronically Signed   By: Rise Mu M.D.   On: 12/17/2019 23:08   CT Code Stroke Cerebral Perfusion with contrast  Result Date: 12/17/2019 CLINICAL DATA:  Initial evaluation for possible stroke, slurred speech, left-sided facial droop. EXAM: CT ANGIOGRAPHY HEAD AND NECK CT PERFUSION BRAIN TECHNIQUE: Multidetector CT imaging of the head and neck was performed using the standard protocol during bolus administration of intravenous contrast. Multiplanar CT image reconstructions and MIPs were obtained to evaluate the vascular anatomy. Carotid stenosis measurements (when applicable) are obtained utilizing NASCET criteria, using the distal internal carotid diameter as the denominator. Multiphase CT imaging of the brain was performed following IV bolus contrast injection. Subsequent parametric perfusion maps were calculated using RAPID software. CONTRAST:  OMNIPAQUE IOHEXOL 350 MG/ML SOLN COMPARISON:  Prior head CT from earlier the same day. FINDINGS: CTA NECK FINDINGS  Aortic arch: Visualized aortic arch of  normal caliber with normal 3 vessel morphology. Mild atheromatous change seen about the arch and origin of the great vessels without hemodynamically significant stenosis. Right carotid system: Right CCA patent from its origin to the bifurcation without stenosis. Scattered atheromatous change about the right bifurcation/proximal right ICA without hemodynamically significant stenosis. Right ICA patent distally to the skull base without stenosis, dissection or occlusion. Left carotid system: Left CCA patent from its origin to the bifurcation without stenosis. Mild mixed plaque about the left bifurcation/proximal left ICA without hemodynamically significant stenosis. Left ICA patent distally to the skull base without stenosis, dissection or occlusion. Vertebral arteries: Both vertebral arteries arise from the subclavian arteries. Right vertebral artery dominant. Mild atheromatous change at about the origin and proximal aspect of the right vertebral artery with no more than mild stenosis. Vertebral arteries otherwise widely patent without stenosis, dissection or occlusion. Skeleton: No acute osseous abnormality. No discrete or worrisome osseous lesions. Reversal of the normal cervical lordosis with mild to moderate cervical spondylosis at C4-5 through C6-7. Other neck: No other acute soft tissue abnormality within the neck. No mass lesion or adenopathy. Upper chest: Visualized upper chest demonstrates no acute finding. Emphysematous changes noted within the visualized lungs. Review of the MIP images confirms the above findings CTA HEAD FINDINGS Anterior circulation: Petrous segments widely patent bilaterally. Scattered calcified plaque within the carotid siphons with associated mild to moderate multifocal narrowing, left slightly worse than right. A1 segments widely patent. Normal anterior communicating artery complex. Anterior cerebral arteries widely patent to their distal aspects without stenosis. No M1 stenosis or  occlusion. Normal left MCA bifurcation. Right MCA trifurcations. Distal MCA branches well perfused and symmetric. Posterior circulation: Vertebral arteries patent to the vertebrobasilar junction without stenosis. Both posterior inferior cerebral arteries patent bilaterally. Minimal irregularity at the vertebrobasilar junction noted, favored to reflect mixing artifact. Basilar widely patent distally without stenosis. Superior cerebral arteries patent bilaterally. Both PCAs primarily supplied via the basilar and are well perfused to their distal aspects. Venous sinuses: Patent allowing for timing the contrast bolus. Anatomic variants: None significant.  No intracranial aneurysm. Review of the MIP images confirms the above findings CT Brain Perfusion Findings: ASPECTS: Not calculated on prior head CT. CBF (<30%) Volume: 0mL Perfusion (Tmax>6.0s) volume: 15mL Mismatch Volume: 15mL Infarction Location:Negative CT perfusion for acute core infarct. Apparent 15 cc area of delayed perfusion at the right occipital region, right PCA distribution. No parenchymal changes seen within this region on prior head CT. No right PCA or posterior circulation stenosis to account for this finding. Finding of uncertain significance, but could reflect an area of evolving ischemia. IMPRESSION: CTA HEAD AND NECK IMPRESSION: 1. Negative CTA for emergent large vessel occlusion. 2. Mild-to-moderate atheromatous change about the carotid siphons with associated mild to moderate multifocal narrowing. 3. Additional mild atheromatous change elsewhere about the major arterial vasculature of the head and neck as above. No other hemodynamically significant or correctable stenosis. 4. Aortic Atherosclerosis (ICD10-I70.0) and Emphysema (ICD10-J43.9). CT PERFUSION IMPRESSION: 1. Negative CT perfusion for acute core infarct. 2. 15 cc area of delayed perfusion at the right occipital lobe, right PCA distribution, of uncertain significance, but could reflect an  area of evolving ischemia. Correlation with dedicated MRI could be performed for further evaluation as warranted. Electronically Signed   By: Rise MuBenjamin  McClintock M.D.   On: 12/17/2019 23:08        Scheduled Meds:   stroke: mapping our early stages of recovery book  Does not apply Once    LOS: 0 days    Time spent: 35 minutes    Livan Hires Hoover Brunette, DO [facility]  If 7PM-7AM, please contact night-coverage [email or web address] 12/18/2019, 7:41 AM

## 2019-12-18 NOTE — Evaluation (Signed)
Speech Language Pathology Evaluation Patient Details Name: Alec Watson MRN: [id number] DOB: February 25, 1956 Today's Date: 12/18/2019 Time: 4854-6270 SLP Time Calculation (min) (ACUTE ONLY): 24 min  Problem List:  Patient Active Problem List   Diagnosis Date Noted   Essential hypertension 12/18/2019   Hypoalbuminemia 12/18/2019   Microcytic anemia 12/18/2019   CVA (cerebral vascular accident) (HCC) 12/18/2019   Acute CVA (cerebrovascular accident) (HCC) 12/17/2019   Past Medical History:  Past Medical History:  Diagnosis Date   Hypertension    Past Surgical History:  Past Surgical History:  Procedure Laterality Date   NECK SURGERY     HPI:  Alec Watson is a 64 y.o. male with medical history significant for hypertension who presents to the emergency department via EMS. Patient was unable to provide history due to speech difficulty. History was obtained from ED physician and from ED medical record. Per report, family noted patient to be confused and unsteady on his feet, so EMS was activated, last known well time was possibly in the morning, he had falling in the yard and was presumed to be laying there for about an hour prior to being noted by family.  He was said to complain of generalized weakness. MRI shows: There is a 1 cm acute infarct in the left paramedian pons, and there is an acute 4 mm infarct in the cerebellar vermis. Additional subcentimeter acute to early subacute infarcts are questioned in the superior aspect of the left cerebellar hemisphere, right occipital lobe, and left temporo-occipital junction. 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 BSE and SLE ordered. Pt failed the Yale swallow screen in the ER.    Assessment / Plan / Recommendation Clinical Impression  Cognitive linguistic evaluation completed at bedside. Pt initially thought to present with aphasia due to  no vocal attempts, however Pt then completed basic confrontation naming tasks. He shook his head yes/no to respond to most questions. Auditory comprehension appears mildly impaired and Pt benefited from repetition with multi-step directions (likely impacted by attention). Verbal expression is moderately impaired with dysarthria resulting in reduced speech intelligibility. Pt only speaking in single words this date. SLP will follow for ongoing cognitive linguistic intervention. Recommend SNF.    SLP Assessment  SLP Recommendation/Assessment: Patient needs continued Speech Lanaguage Pathology Services SLP Visit Diagnosis: Dysarthria and anarthria (R47.1);Cognitive communication deficit (R41.841)    Follow Up Recommendations  Skilled Nursing facility    Frequency and Duration min 2x/week  1 week      SLP Evaluation Cognition  Overall Cognitive Status: Impaired/Different from baseline Arousal/Alertness: Awake/alert Orientation Level: Oriented X4 Attention: Sustained Sustained Attention: Impaired Sustained Attention Impairment: Verbal basic Memory:  (could not fully assess) Awareness: Impaired Awareness Impairment: Emergent impairment Problem Solving:  (To be assessed) Executive Function: Decision Making Decision Making: Impaired Decision Making Impairment: Verbal basic Behaviors: Restless       Comprehension  Auditory Comprehension Overall Auditory Comprehension: Impaired Yes/No Questions: Within Functional Limits Commands: Impaired Two Step Basic Commands: 50-74% accurate Multistep Basic Commands: 50-74% accurate Conversation: Simple EffectiveTechniques: Repetition Visual Recognition/Discrimination Discrimination: Not tested Reading Comprehension Reading Status: Not tested    Expression Expression Primary Mode of Expression: Verbal Verbal Expression Overall Verbal Expression: Impaired Initiation: Impaired Level of Generative/Spontaneous Verbalization: Word Repetition:  Impaired Level of Impairment: Word level Naming: Impairment Responsive: 51-75% accurate Confrontation: Impaired Convergent: 75-100% accurate Divergent: Not tested Pragmatics: No impairment Interfering Components: Speech intelligibility Non-Verbal Means of Communication: Not applicable  Written Expression Dominant Hand: Right Written Expression: Not tested   Oral / Motor  Oral Motor/Sensory Function Overall Oral Motor/Sensory Function: Moderate impairment Facial ROM: Reduced right;Suspected CN VII (facial) dysfunction Facial Symmetry: Abnormal symmetry right;Suspected CN VII (facial) dysfunction Lingual ROM: Reduced right;Reduced left Lingual Strength: Reduced Velum:  (could not visualize) Mandible: Impaired Motor Speech Overall Motor Speech: Impaired Respiration: Impaired Level of Impairment: Word Phonation: Wet;Low vocal intensity Resonance: Within functional limits Articulation: Impaired Level of Impairment: Word Intelligibility: Intelligibility reduced Word: 75-100% accurate Phrase: Not tested Sentence: Not tested Conversation: Not tested Motor Planning:  (needs further assessment) Motor Speech Errors: Aware Effective Techniques: Over-articulate;Increased vocal intensity   Thank you,  Havery Moros, CCC-SLP [phone]                    Maloni Musleh 12/18/2019, 4:40 PM

## 2019-12-18 NOTE — Progress Notes (Signed)
EEG completed, results pending. 

## 2019-12-18 NOTE — Procedures (Signed)
°  [facility] Alec Fallin [name], MD     [email or web address]           HISTORY: This is a 64 year old who presents with altered mental status. This study is being done to evaluate for nonconvulsive seizures.  MEDICATIONS:  Current Facility-Administered Medications:     stroke: mapping our early stages of recovery book, , Does not apply, Once, Adefeso, Oladapo, DO   aspirin chewable tablet 81 mg, 81 mg, Oral, Daily, Sherryll Burger, Pratik D, DO   simvastatin (ZOCOR) tablet 40 mg, 40 mg, Oral, q1800, Sherryll Burger, Pratik D, DO     ANALYSIS: A 16 channel recording using standard 10 20 measurements is conducted for 24 minutes.  There is a posterior dominant rhythm that gets as high as 7-8 hertz. There is beta activity observed in the frontal areas. The recording transitions to drowsiness. There is episodic bilateral theta slowing seen in the temporal areas. Photic stimulation and hyperventilation are not carried out. There is no focal or lateral slowing. There is no epileptiform activities noted.   IMPRESSION: 1. This recording of the awake and drowsy state shows mild global slowing indicating a mild global encephalopathy. However no epileptiform activities are noted.      Alec Watson [name], M.D.  Diplomate, Biomedical engineer of Psychiatry and Neurology ( Neurology).

## 2019-12-18 NOTE — Evaluation (Signed)
Clinical/Bedside Swallow Evaluation Patient Details  Name: CELIA GIBBONS MRN: [id number] Date of Birth: 10/11/1955  Today's Date: 12/18/2019 Time: SLP Start Time (ACUTE ONLY): 1420 SLP Stop Time (ACUTE ONLY): 1439 SLP Time Calculation (min) (ACUTE ONLY): 19 min  Past Medical History:  Past Medical History:  Diagnosis Date  . Hypertension    Past Surgical History:  Past Surgical History:  Procedure Laterality Date  . NECK SURGERY     HPI:  SANDERS MANNINEN is a 64 y.o. male with medical history significant for hypertension who presents to the emergency department via EMS. Patient was unable to provide history due to speech difficulty. History was obtained from ED physician and from ED medical record. Per report, family noted patient to be confused and unsteady on his feet, so EMS was activated, last known well time was possibly in the morning, he had falling in the yard and was presumed to be laying there for about an hour prior to being noted by family.  He was said to complain of generalized weakness. MRI shows: There is a 1 cm acute infarct in the left paramedian pons, and there is an acute 4 mm infarct in the cerebellar vermis. Additional subcentimeter acute to early subacute infarcts are questioned in the superior aspect of the left cerebellar hemisphere, right occipital lobe, and left temporo-occipital junction. 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 BSE and SLE ordered. Pt failed the Yale swallow screen in the ER.    Assessment / Plan / Recommendation Clinical Impression  Clinical swallow evaluation completed at bedside with Pt's daughter present (she does not appear to know him well and indicates that he is homeless). Pt currently presents with suspected severe oropharyngeal dysphagia with oral holding and pooling of secretions, swallow attempts with ice chips and water resulting in  delayed and immediate coughing. Pt refused trials of puree. Recommend NPO with suction set up at bedside and oral care to facilitate swallowing. Medications will need to be presented in non oral fashion at this time. Above to RN. SLP will follow tomorrow to determine if appropriate for MBSS (not yet ready).    SLP Visit Diagnosis: Dysphagia, oropharyngeal phase (R13.12)    Aspiration Risk  Severe aspiration risk;Risk for inadequate nutrition/hydration    Diet Recommendation NPO;Alternative means - temporary;Ice chips PRN after oral care   Medication Administration: Via alternative means    Other  Recommendations Oral Care Recommendations: Oral care QID;Oral care prior to ice chip/H20;Staff/trained caregiver to provide oral care (provide oral suction at bedside) Other Recommendations: Have oral suction available;Remove water pitcher   Follow up Recommendations Skilled Nursing facility      Frequency and Duration min 3x week  1 week       Prognosis Prognosis for Safe Diet Advancement: Fair Barriers to Reach Goals: Severity of deficits      Swallow Study   General Date of Onset: 12/17/19 HPI: ELERY CADENHEAD is a 64 y.o. male with medical history significant for hypertension who presents to the emergency department via EMS. Patient was unable to provide history due to speech difficulty. History was obtained from ED physician and from ED medical record. Per report, family noted patient to be confused and unsteady on his feet, so EMS was activated, last known well time was possibly in the morning, he had falling in the yard and was presumed to be laying there for about an hour prior to being noted by  family.  He was said to complain of generalized weakness. MRI shows: There is a 1 cm acute infarct in the left paramedian pons, and there is an acute 4 mm infarct in the cerebellar vermis. Additional subcentimeter acute to early subacute infarcts are questioned in the superior aspect of the  left cerebellar hemisphere, right occipital lobe, and left temporo-occipital junction. 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 BSE and SLE ordered. Pt failed the Yale swallow screen in the ER.  Type of Study: Bedside Swallow Evaluation Previous Swallow Assessment: None on record Diet Prior to this Study: NPO Temperature Spikes Noted: No Respiratory Status: Room air History of Recent Intubation: No Behavior/Cognition: Alert;Cooperative;Requires cueing (became labile) Oral Cavity Assessment: Excessive secretions Oral Care Completed by SLP: No Oral Cavity - Dentition: Missing dentition;Adequate natural dentition Vision: Functional for self-feeding Self-Feeding Abilities: Able to feed self;Needs set up Patient Positioning: Upright in bed Baseline Vocal Quality: Wet Volitional Cough: Congested;Weak Volitional Swallow: Able to elicit    Oral/Motor/Sensory Function Overall Oral Motor/Sensory Function: Moderate impairment Facial ROM: Reduced right;Suspected CN VII (facial) dysfunction Facial Symmetry: Abnormal symmetry right;Suspected CN VII (facial) dysfunction Lingual ROM: Reduced right;Reduced left Lingual Strength: Reduced Velum:  (could not visualize) Mandible: Impaired   Ice Chips Ice chips: Impaired Presentation: Spoon Oral Phase Impairments: Reduced labial seal;Reduced lingual movement/coordination Oral Phase Functional Implications: Prolonged oral transit;Oral holding Pharyngeal Phase Impairments: Suspected delayed Swallow;Decreased hyoid-laryngeal movement;Wet Vocal Quality   Thin Liquid Thin Liquid: Impaired Presentation: Spoon;Cup;Self Fed Oral Phase Impairments: Reduced labial seal;Reduced lingual movement/coordination;Poor awareness of bolus Oral Phase Functional Implications: Right anterior spillage;Left anterior spillage;Prolonged oral transit Pharyngeal  Phase Impairments: Suspected  delayed Swallow;Cough - Immediate;Decreased hyoid-laryngeal movement    Nectar Thick Nectar Thick Liquid: Not tested   Honey Thick Honey Thick Liquid: Not tested   Puree Puree:  (Pt refused) Presentation: Spoon   Solid     Solid: Not tested     Thank you,  Havery Moros, CCC-SLP [phone]  Naylani Bradner 12/18/2019,4:27 PM

## 2019-12-18 NOTE — ED Notes (Signed)
Dr. Sherryll Burger notified of BP 180/104 HR 47 - consistent with previous. No SOB, Neuro as baseline. Will cont to monitor.

## 2019-12-18 NOTE — Progress Notes (Signed)
   12/18/19 1827  Assess: MEWS Score  BP (!) 216/97  Pulse Rate (!) 48  Resp 16  Level of Consciousness Alert  SpO2 98 %  O2 Device Room Air  Assess: MEWS Score  MEWS Temp 0  MEWS Systolic 2  MEWS Pulse 1  MEWS RR 0  MEWS LOC 0  MEWS Score 3  MEWS Score Color Yellow  Assess: if the MEWS score is Yellow or Red  Were vital signs taken at a resting state? Yes  Focused Assessment No change from prior assessment  Early Detection of Sepsis Score *See Row Information* Low  MEWS guidelines implemented *See Row Information* Yes  Treat  MEWS Interventions Administered prn meds/treatments  Pain Scale 0-10  Pain Score 0  Take Vital Signs  Increase Vital Sign Frequency  Yellow: Q 2hr X 2 then Q 4hr X 2, if remains yellow, continue Q 4hrs  Escalate  MEWS: Escalate Yellow: discuss with charge nurse/RN and consider discussing with provider and RRT  Notify: Charge Nurse/RN  Name of Charge Nurse/RN Notified Chales Abrahams  Date Charge Nurse/RN Notified 12/18/19  Time Charge Nurse/RN Notified 1845  Notify: Provider  Provider Name/Title Pratik Sherryll Burger  Date Provider Notified 12/18/19  Time Provider Notified 1845  Notification Type Page  Notification Reason Change in status  Response See new orders  Date of Provider Response 12/18/19  Time of Provider Response 1846

## 2019-12-19 ENCOUNTER — Inpatient Hospital Stay (HOSPITAL_COMMUNITY): Payer: Medicaid Other

## 2019-12-19 LAB — CBC
HCT: 40.1 % (ref 39.0–52.0)
Hemoglobin: 13 g/dL (ref 13.0–17.0)
MCH: 25.1 pg — ABNORMAL LOW (ref 26.0–34.0)
MCHC: 32.4 g/dL (ref 30.0–36.0)
MCV: 77.6 fL — ABNORMAL LOW (ref 80.0–100.0)
Platelets: 246 10*3/uL (ref 150–400)
RBC: 5.17 MIL/uL (ref 4.22–5.81)
RDW: 12.9 % (ref 11.5–15.5)
WBC: 10.2 10*3/uL (ref 4.0–10.5)
nRBC: 0 % (ref 0.0–0.2)

## 2019-12-19 LAB — HOMOCYSTEINE: Homocysteine: 16.6 umol/L (ref 0.0–17.2)

## 2019-12-19 LAB — MAGNESIUM: Magnesium: 2.1 mg/dL (ref 1.7–2.4)

## 2019-12-19 LAB — BASIC METABOLIC PANEL
Anion gap: 10 (ref 5–15)
BUN: 9 mg/dL (ref 8–23)
CO2: 22 mmol/L (ref 22–32)
Calcium: 9.3 mg/dL (ref 8.9–10.3)
Chloride: 108 mmol/L (ref 98–111)
Creatinine, Ser: 0.87 mg/dL (ref 0.61–1.24)
GFR calc Af Amer: >60 mL/min (ref >60)
GFR calc non Af Amer: >60 mL/min (ref >60)
Glucose, Bld: 110 mg/dL — ABNORMAL HIGH (ref 70–99)
Potassium: 3.5 mmol/L (ref 3.5–5.1)
Sodium: 140 mmol/L (ref 135–145)

## 2019-12-19 LAB — RPR: RPR Ser Ql: NONREACTIVE

## 2019-12-19 MED ORDER — LABETALOL HCL 5 MG/ML IV SOLN
10.0000 mg | Freq: Once | INTRAVENOUS | Status: AC
  Administered 2019-12-19: 10 mg via INTRAVENOUS
  Filled 2019-12-19: qty 4

## 2019-12-19 MED ORDER — DEXTROSE-NACL 5-0.45 % IV SOLN: INTRAVENOUS | Status: AC

## 2019-12-19 NOTE — Progress Notes (Signed)
MD notified of bp with new order given. IV bp medication administered as ordered.

## 2019-12-19 NOTE — Progress Notes (Signed)
Pt assisted back to bed, able to stand and transfer without difficulty. Condom cath replaced. Pt taking thickened liquids without difficulty. Denies c/o.

## 2019-12-19 NOTE — Evaluation (Signed)
Occupational Therapy Evaluation Patient Details Name: Alec Watson MRN: [id number] DOB: [birth year] Today's Date: 12/19/2019    History of Present Illness Alec Watson is a 64 y.o. male with medical history significant for hypertension who presents to the emergency department via EMS. Patient was unable to provide history due to speech difficulty. History was obtained from ED physician and from ED medical record. Per report, family noted patient to be confused and unsteady on his feet, so EMS was activated, last known well time was possibly in the morning, he had falling in the yard and was presumed to be laying there for about an hour prior to being noted by family.  He was said to complain of generalized weakness.   Clinical Impression   Pt agreeable to OT evaluation this am, PT joined during session. Pt with RUE spasticity limiting mobility and functional use, increased time and max verbal cuing to incorporate RUE into tasks. Pt performing mobility tasks using RUE for holding RW and was able to maintain grip throughout tasks. Recommend SNF on discharge to improve safety and independence in ADLs and functional mobility.      Follow Up Recommendations  SNF    Equipment Recommendations  None recommended by OT       Precautions / Restrictions Precautions Precautions: Fall Restrictions Weight Bearing Restrictions: No      Mobility Bed Mobility               General bed mobility comments: Pt up in chair on OT arrival   Transfers Overall transfer level: Needs assistance Equipment used: Rolling walker (2 wheeled) Transfers: Sit to/from [employer] Sit to Stand: Mod assist Stand pivot transfers: Mod assist       General transfer comment: slow labored movment, frequent clonus of RLE        ADL either performed or assessed with clinical judgement   ADL Overall ADL's : Needs assistance/impaired     Grooming: Set up;Sitting Grooming Details  (indicate cue type and reason): pt using LUE for task completion, cuing for involving RUE             Lower Body Dressing: Maximal assistance;Sitting/lateral leans;Sit to/from stand   Toilet Transfer: Moderate assistance;Stand-pivot;BSC;RW   Toileting- Clothing Manipulation and Hygiene: Maximal assistance;Sitting/lateral lean;Sit to/from stand       Functional mobility during ADLs: Moderate assistance;Maximal assistance;Rolling walker (w/c follow) General ADL Comments: Max cuing for reciprocal movements in RUE/RLE, cuing for involving in tasks     Vision Baseline Vision/History: No visual deficits Patient Visual Report: No change from baseline Vision Assessment?: No apparent visual deficits            Pertinent Vitals/Pain Pain Assessment: No/denies pain     Hand Dominance Right   Extremity/Trunk Assessment Upper Extremity Assessment Upper Extremity Assessment: RUE deficits/detail RUE Deficits / Details: RUE mild/mod spasticity present. Strength 3-/5 in shoulder, elbow, wrist, decreased grip strength and functional RUE use RUE Sensation: WNL RUE Coordination: decreased fine motor;decreased gross motor   Lower Extremity Assessment Lower Extremity Assessment: Defer to PT evaluation   Cervical / Trunk Assessment Cervical / Trunk Assessment: Normal   Communication Communication Communication: Expressive difficulties   Cognition Arousal/Alertness: Awake/alert Behavior During Therapy: WFL for tasks assessed/performed Overall Cognitive Status: Within Functional Limits for tasks assessed  General Comments       Exercises Exercises: General Upper Extremity General Exercises - Upper Extremity Shoulder Flexion: PROM;10 reps Shoulder ABduction: PROM;10 reps Shoulder ADduction: PROM;10 reps Shoulder Horizontal ABduction: PROM;10 reps Shoulder Horizontal ADduction: PROM;10 reps Elbow Flexion: PROM;10 reps;AROM;5  reps Elbow Extension: PROM;10 reps;AROM;5 reps Wrist Flexion: PROM;10 reps;AROM;5 reps Wrist Extension: PROM;10 reps;AROM;5 reps Digit Composite Flexion: AROM;10 reps Composite Extension: AROM;10 reps General Exercises - Lower Extremity Long Arc Quad: Seated;AROM;Strengthening;Both;10 reps Hip Flexion/Marching: Seated;AROM;Strengthening;Both;10 reps Toe Raises: Seated;AROM;Strengthening;Both;10 reps Heel Raises: Seated;AROM;Strengthening;Both;10 reps   Shoulder Instructions      Home Living Family/patient expects to be discharged to:: Skilled nursing facility Living Arrangements:  (unsure)   Type of Home: Homeless                                  Prior Functioning/Environment Level of Independence: Independent with assistive device(s)        Comments: Community ambulator using SPC, does not drive, independent in ADL completion        OT Problem List: Decreased strength;Decreased activity tolerance;Impaired balance (sitting and/or standing);Decreased safety awareness;Decreased coordination;Decreased knowledge of use of DME or AE;Impaired UE functional use      OT Treatment/Interventions: Self-care/ADL training;Therapeutic exercise;Neuromuscular education;DME and/or AE instruction;Therapeutic activities;Manual therapy;Patient/family education    OT Goals(Current goals can be found in the care plan section) Acute Rehab OT Goals Patient Stated Goal: to go home OT Goal Formulation: With patient Time For Goal Achievement: 01/02/20 Potential to Achieve Goals: Good  OT Frequency: Min 2X/week   Barriers to D/C: Decreased caregiver support          Co-evaluation PT/OT/SLP Co-Evaluation/Treatment: Yes Reason for Co-Treatment: Complexity of the patient's impairments (multi-system involvement);For patient/therapist safety;To address functional/ADL transfers PT goals addressed during session: Mobility/safety with mobility;Balance;Proper use of  DME;Strengthening/ROM OT goals addressed during session: ADL's and self-care;Proper use of Adaptive equipment and DME         End of Session Equipment Utilized During Treatment: Gait belt;Rolling walker  Activity Tolerance: Patient tolerated treatment well Patient left: in chair;with call bell/phone within reach;with chair alarm set  OT Visit Diagnosis: Muscle weakness (generalized) (M62.81);Hemiplegia and hemiparesis Hemiplegia - Right/Left: Right Hemiplegia - dominant/non-dominant: Dominant Hemiplegia - caused by: Cerebral infarction                Time: 3903-0092 OT Time Calculation (min): 35 min Charges:  OT General Charges $OT Visit: 1 Visit OT Evaluation $OT Eval Low Complexity: 1 Low   Ezra Sites, OTR/L  [phone] 12/19/2019, 9:50 AM

## 2019-12-19 NOTE — Progress Notes (Signed)
Physical Therapy Treatment Patient Details Name: Alec Watson MRN: [id number] DOB: 1955/07/12 Today's Date: 12/19/2019    History of Present Illness Alec Watson is a 64 y.o. male with medical history significant for hypertension who presents to the emergency department via EMS. Patient was unable to provide history due to speech difficulty. History was obtained from ED physician and from ED medical record. Per report, family noted patient to be confused and unsteady on his feet, so EMS was activated, last known well time was possibly in the morning, he had falling in the yard and was presumed to be laying there for about an hour prior to being noted by family.  He was said to complain of generalized weakness.    PT Comments    Patient demonstrates increased endurance/distance for taking steps in room followed with wheelchair for safety, requires frequent verbal cues for touching right heel down due to limited dorsiflexion, poor coordination/motor control and fatigue, when completing exercises seat in chair has frequent episodes of clonus in RLE, able to stop by stretching calf/gastrocs and tolerated staying up in chair after therapy.  Patient will benefit from continued physical therapy in hospital and recommended venue below to increase strength, balance, endurance for safe ADLs and gait.    Follow Up Recommendations  SNF     Equipment Recommendations  Rolling walker with 5" wheels    Recommendations for Other Services       Precautions / Restrictions Precautions Precautions: Fall Restrictions Weight Bearing Restrictions: No    Mobility  Bed Mobility               General bed mobility comments: Patient presents seated in chair (assisted by nursing staff)  Transfers Overall transfer level: Needs assistance Equipment used: Rolling walker (2 wheeled) Transfers: Sit to/from [employer] Transfers Sit to Stand: Mod assist Stand pivot transfers: Mod assist        General transfer comment: slow labored movment, frequent clonus of RLE  Ambulation/Gait Ambulation/Gait assistance: Mod assist;Max assist Gait Distance (Feet): 18 Feet Assistive device: Rolling walker (2 wheeled) Gait Pattern/deviations: Decreased step length - right;Decreased stance time - right;Decreased stride length;Decreased dorsiflexion - right;Scissoring;Narrow base of support Gait velocity: decreased   General Gait Details: slow labored cadence with diffiuclty attempting heel to toe stepping with RLE due to limited dorsiflexion, spasticity, and fair/poor coordination   Stairs             Wheelchair Mobility    Modified Rankin (Stroke Patients Only)       Balance Overall balance assessment: Needs assistance Sitting-balance support: Feet supported;No upper extremity supported Sitting balance-Leahy Scale: Fair Sitting balance - Comments: fair/good seated in chair   Standing balance support: During functional activity;Bilateral upper extremity supported Standing balance-Leahy Scale: Poor Standing balance comment: using RW                            Cognition Arousal/Alertness: Awake/alert Behavior During Therapy: WFL for tasks assessed/performed Overall Cognitive Status: Within Functional Limits for tasks assessed                                        Exercises General Exercises - Lower Extremity Long Arc Quad: Seated;AROM;Strengthening;Both;10 reps Hip Flexion/Marching: Seated;AROM;Strengthening;Both;10 reps Toe Raises: Seated;AROM;Strengthening;Both;10 reps Heel Raises: Seated;AROM;Strengthening;Both;10 reps    General Comments  Pertinent Vitals/Pain Pain Assessment: No/denies pain    Home Living                      Prior Function            PT Goals (current goals can now be found in the care plan section) Acute Rehab PT Goals Patient Stated Goal: walk again PT Goal Formulation: With  patient/family Time For Goal Achievement: 01/01/20 Potential to Achieve Goals: Good Progress towards PT goals: Progressing toward goals    Frequency    Min 5X/week      PT Plan Current plan remains appropriate    Co-evaluation PT/OT/SLP Co-Evaluation/Treatment: Yes Reason for Co-Treatment: For patient/therapist safety PT goals addressed during session: Mobility/safety with mobility;Balance;Proper use of DME;Strengthening/ROM        AM-PAC PT "6 Clicks" Mobility   Outcome Measure  Help needed turning from your back to your side while in a flat bed without using bedrails?: A Lot Help needed moving from lying on your back to sitting on the side of a flat bed without using bedrails?: A Lot Help needed moving to and from a bed to a chair (including a wheelchair)?: A Lot Help needed standing up from a chair using your arms (e.g., wheelchair or bedside chair)?: A Lot Help needed to walk in hospital room?: A Lot Help needed climbing 3-5 steps with a railing? : Total 6 Click Score: 11    End of Session   Activity Tolerance: Patient tolerated treatment well;Patient limited by fatigue Patient left: in chair;with call bell/phone within reach;with chair alarm set Nurse Communication: Mobility status PT Visit Diagnosis: Unsteadiness on feet (R26.81);Other abnormalities of gait and mobility (R26.89);Muscle weakness (generalized) (M62.81)     Time: 1610-9604 PT Time Calculation (min) (ACUTE ONLY): 31 min  Charges:  $Gait Training: 8-22 mins $Therapeutic Exercise: 8-22 mins                     9:35 AM, 12/19/19 [name], MPT Physical Therapist with [facility] 336 [phone] office [phone] mobile phone

## 2019-12-19 NOTE — Progress Notes (Addendum)
  Speech Language Pathology Treatment: Dysphagia  Patient Details Name: Alec Watson MRN: [id number] DOB: 12-31-1955 Today's Date: 12/19/2019 Time: 5056-9794 SLP Time Calculation (min) (ACUTE ONLY): 28 min  Assessment / Plan / Recommendation Clinical Impression  Pt seen for ongoing dysphagia intervention following clinical swallow evaluation yesterday. Pt appears clinically improved. He is following commands and verbally communicating in short sentences with moderately reduced intelligibility. Pt assessed with ice chips, thin water via cup sips, puree/magic cup, and graham crackers. Pt with reduced labial seal on the right, suspected delay in swallow initiation, occasional delayed cough after thin liquids, and mild/mod lingual residue with solids. Pt expresses a desire to eat. Plan for MBSS today if radiology can accommodate, continue NPO until MBSS.   Addendum: Radiology fluoro equipment is not working today, so MBSS could not be completed. Will initiate a conservative diet of D2 and NTL (nectar thick liquids) until MBSS can be completed.   HPI HPI: DEO MEHRINGER is a 64 y.o. male with medical history significant for hypertension who presents to the emergency department via EMS. Patient was unable to provide history due to speech difficulty. History was obtained from ED physician and from ED medical record. Per report, family noted patient to be confused and unsteady on his feet, so EMS was activated, last known well time was possibly in the morning, he had falling in the yard and was presumed to be laying there for about an hour prior to being noted by family.  He was said to complain of generalized weakness. MRI shows: There is a 1 cm acute infarct in the left paramedian pons, and there is an acute 4 mm infarct in the cerebellar vermis. Additional subcentimeter acute to early subacute infarcts are questioned in the superior aspect of the left cerebellar hemisphere, right occipital lobe, and  left temporo-occipital junction. 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 BSE and SLE ordered. Pt failed the Yale swallow screen in the ER.       SLP Plan  MBS       Recommendations  Diet recommendations:  (pending results of MBSS) D2/NTL                Oral Care Recommendations: Oral care QID;Oral care prior to ice chip/H20;Staff/trained caregiver to provide oral care Follow up Recommendations: Skilled Nursing facility SLP Visit Diagnosis: Dysphagia, oropharyngeal phase (R13.12) Plan: MBS       Thank you,  Havery Moros, CCC-SLP [phone]                 Averyanna Sax 12/19/2019, 1:45 PM

## 2019-12-19 NOTE — Plan of Care (Signed)
°  Problem: Acute Rehab OT Goals (only OT should resolve) Goal: Pt. Will Perform Eating Flowsheets (Taken 12/19/2019 1004) Pt Will Perform Eating:  with set-up  sitting Goal: Pt. Will Perform Grooming Flowsheets (Taken 12/19/2019 1004) Pt Will Perform Grooming:  with set-up  sitting  standing Goal: Pt. Will Perform Upper Body Dressing Flowsheets (Taken 12/19/2019 1004) Pt Will Perform Upper Body Dressing:  with supervision  sitting Goal: Pt. Will Transfer To Toilet Flowsheets (Taken 12/19/2019 1004) Pt Will Transfer to Toilet:  with min assist  stand pivot transfer  ambulating  regular height toilet  bedside commode Goal: Pt. Will Perform Toileting-Clothing Manipulation Flowsheets (Taken 12/19/2019 1004) Pt Will Perform Toileting - Clothing Manipulation and hygiene:  with min assist  sitting/lateral leans  sit to/from stand Goal: Pt/Caregiver Will Perform Home Exercise Program Flowsheets (Taken 12/19/2019 1004) Pt/caregiver will Perform Home Exercise Program:  Increased strength  Right Upper extremity  With Supervision  With written HEP provided

## 2019-12-19 NOTE — NC FL2 (Signed)
  [place] MEDICAID FL2 LEVEL OF CARE SCREENING TOOL     IDENTIFICATION  Patient Name: Alec Watson Birthdate: [birth year] Sex: male Admission Date (Current Location): 12/17/2019  [facility] and [place] Number:  [employer] and Address:  [facility],  [address], [place] [address]      Provider Number:    Attending Physician Name and Address:  Erick Blinks, DO  Relative Name and Phone Number:       Current Level of Care: Hospital Recommended Level of Care: Skilled Nursing Facility Prior Approval Number:    Date Approved/Denied: 12/19/19 PASRR Number: [id number]  Discharge Plan: SNF    Current Diagnoses: Patient Active Problem List   Diagnosis Date Noted  . Essential hypertension 12/18/2019  . Hypoalbuminemia 12/18/2019  . Microcytic anemia 12/18/2019  . CVA (cerebral vascular accident) (HCC) 12/18/2019  . Acute CVA (cerebrovascular accident) (HCC) 12/17/2019    Orientation RESPIRATION BLADDER Height & Weight     Self, Time, Situation, Place  Normal Incontinent, External catheter Weight: 138 lb 10.7 oz (62.9 kg) Height:  5\' 7"  (170.2 cm)  BEHAVIORAL SYMPTOMS/MOOD NEUROLOGICAL BOWEL NUTRITION STATUS      Continent Diet (DIET DYS 2 Room service appropriate? Yes; Fluid consistency: Nectar Thick)  AMBULATORY STATUS COMMUNICATION OF NEEDS Skin   Extensive Assist   Normal                       Personal Care Assistance Level of Assistance  Feeding, Bathing, Dressing Bathing Assistance: Maximum assistance Feeding assistance: Limited assistance Dressing Assistance: Maximum assistance     Functional Limitations Info  Sight, Hearing, Speech Sight Info: Adequate Hearing Info: Adequate Speech Info: Adequate    SPECIAL CARE FACTORS FREQUENCY  PT (By licensed PT), Speech therapy     PT Frequency: 5x week       Speech Therapy Frequency: 5x week      Contractures Contractures Info: Not present    Additional Factors  Info  Allergies, Code Status Code Status Info: Full Allergies Info: N/A           Current Medications (12/19/2019):  This is the current hospital active medication list Current Facility-Administered Medications  Medication Dose Route Frequency Provider Last Rate Last Admin  .  stroke: mapping our early stages of recovery book   Does not apply Once Adefeso, Oladapo, DO      . aspirin chewable tablet 81 mg  81 mg Oral Daily Shah, Pratik D, DO      . dextrose 5 %-0.45 % sodium chloride infusion   Intravenous Continuous Shah, Pratik D, DO 50 mL/hr at 12/19/19 1600 Rate Verify at 12/19/19 1600  . hydrALAZINE (APRESOLINE) injection 10 mg  10 mg Intravenous Q4H PRN 12/21/19, Pratik D, DO   10 mg at 12/19/19 0040  . simvastatin (ZOCOR) tablet 40 mg  40 mg Oral q1800 12/21/19 D, DO         Discharge Medications: Please see discharge summary for a list of discharge medications.  Relevant Imaging Results:  Relevant Lab Results:   Additional Information Pt SSN: [name]  [id number], LCSW

## 2019-12-19 NOTE — Progress Notes (Signed)
PROGRESS NOTE    Alec Watson  ZOX:[id number] DOB: 1956/01/19 DOA: 12/17/2019 PCP: Patient, No Pcp Per   Brief Narrative:  Per HPI: Alec Watson a 64 y.o.malewith medical history significant forhypertension who presents to the emergency department via EMS. Patient was unable to provide history due to speech difficulty. History was obtained from ED physician and from ED medical record. Per report, family noted patient to be confused and unsteady on his feet, so EMS was activated,last known well time was possibly in the morning, he had falling in the yard and was presumed to be laying there for about an hour prior to being noted by family.Hewas said to complain of generalized weakness.  9/13: Patient admitted with speech difficulty, generalized weakness and unsteadiness with concern for CVA.  Brain MRI with acute CVA to pons region noted with 2D echocardiogram as well as neurology on consultation ordered and pending.  PT evaluation pending, but patient may very likely require SNF as he appears to have significant deficits.  9/14: Patient recommended for SNF.  2D echocardiogram without significant abnormalities.  He continues have significant difficulty with swallowing and SLP will pursue MBS.  Neurology evaluation pending.  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 and will pursue MBS, continue IV fluid for now while n.p.o. -Appreciate neurology  evaluation  Essential hypertension -Allow permissive hypertension at this time -Plan to order labetalol for significant elevations above 220/110  Hypoalbuminemia -Supplementation once patient can tolerate p.o. -Albumin 3.3  Microcytic anemia-stable -Iron studies within normal limits -Continue to follow CBC   DVT prophylaxis: SCDs Code Status: Full Family Communication: Spoke with sister on phone 9/14 Disposition Plan:  Status is: Inpatient  Remains inpatient appropriate because:Ongoing diagnostic testing needed not appropriate for outpatient work up and Inpatient level of care appropriate due to severity of illness   Dispo: The patient is from: Home              Anticipated d/c is to: SNF              Anticipated d/c date is: 2 days              Patient currently is not medically stable to d/c.   Consultants:   Neurology  Procedures:   See below  Antimicrobials:   None  Subjective: Patient seen and evaluated today with no acute overnight events noted.  He is still unable to swallow and has not recovered much of his speech.  Objective: Vitals:   12/19/19 0238 12/19/19 0634 12/19/19 1030 12/19/19 1410  BP: (!) 181/86 (!) 166/83 (!) 160/102 (!) 159/83  Pulse: 72 72 (!) 52 (!) 56  Resp: 20 20 16 16   Temp: 98.4 F (36.9 C) 98.9 F (37.2 C) 98 F (36.7 C) 98.7 F (37.1 C)  TempSrc: Oral Oral Oral Oral  SpO2: 100% 98% 98% 96%  Weight:      Height:        Intake/Output Summary (Last 24 hours) at 12/19/2019 1514 Last  data filed at 12/18/2019 1900 Gross per 24 hour  Intake 0 ml  Output --  Net 0 ml   Filed Weights   12/17/19 1722 12/18/19 1417  Weight: 67.5 kg 62.9 kg    Examination:  General exam: Appears calm and comfortable  Respiratory system: Clear to auscultation. Respiratory effort normal. Cardiovascular system: S1 & S2 heard, RRR.  Gastrointestinal system: Abdomen is nondistended, soft and nontender.  Central nervous system: Alert and  awake Extremities: No edema Skin: No rashes, lesions or ulcers Psychiatry: Flat affect    Data Reviewed: I have personally reviewed following labs and imaging studies  CBC: Recent Labs  Lab 12/17/19 1813 12/18/19 0538 12/19/19 0624  WBC 5.5 6.1 10.2  NEUTROABS 3.2  --   --   HGB 11.7* 12.3* 13.0  HCT 35.9* 38.1* 40.1  MCV 78.4* 78.1* 77.6*  PLT 227 232 246   Basic Metabolic Panel: Recent Labs  Lab 12/17/19 1813 12/18/19 0538 12/19/19 0624  NA 138 139 140  K 3.8 3.9 3.5  CL 108 107 108  CO2 GLUCOSE 108* 95 110*  BUN CREATININE 0.83 0.81 0.87  CALCIUM 8.8* 9.1 9.3  MG  --  2.2 2.1  PHOS  --  3.5  --    GFR: Estimated Creatinine Clearance: 76.3 mL/min (by C-G formula based on SCr of 0.87 mg/dL). Liver Function Tests: Recent Labs  Lab 12/17/19 1813 12/18/19 0538  AST 14* 14*  ALT 9 11  ALKPHOS 61 52  BILITOT 0.4 0.7  PROT 6.4* 6.3*  ALBUMIN 3.3* 3.3*   No results for input(s): LIPASE, AMYLASE in the last 168 hours. No results for input(s): AMMONIA in the last 168 hours. Coagulation Profile: Recent Labs  Lab 12/17/19 1813 12/18/19 0538  INR 1.0 1.1   Cardiac Enzymes: No results for input(s): CKTOTAL, CKMB, CKMBINDEX, TROPONINI in the last 168 hours. BNP (last 3 results) No results for input(s): PROBNP in the last 8760 hours. HbA1C: Recent Labs    12/18/19 0538  HGBA1C 5.7*   CBG: Recent Labs  Lab 12/17/19 1709  GLUCAP 163*   Lipid Profile: Recent Labs    12/18/19 0538  CHOL 181  HDL 57  LDLCALC 114*  TRIG 50  CHOLHDL 3.2   Thyroid Function Tests: Recent Labs    12/18/19 1048  TSH 2.497   Anemia Panel: Recent Labs    12/18/19 0538 12/18/19 1048  VITAMINB12  --  98*  FERRITIN 31  --   TIBC 277  --   IRON 116  --    Sepsis Labs: No results for input(s): PROCALCITON, LATICACIDVEN in the last 168 hours.  Recent Results (from the past 240 hour(s))  SARS Coronavirus 2 by RT PCR (hospital order, performed  in [facility] hospital lab) Nasopharyngeal Nasopharyngeal Swab     Status: None   Collection Time: 12/17/19  5:55 PM   Specimen: Nasopharyngeal Swab  Result Value Ref Range Status   SARS Coronavirus 2 NEGATIVE NEGATIVE Final    Comment: (NOTE) SARS-CoV-2 target nucleic acids are NOT DETECTED.  The SARS-CoV-2 RNA is generally detectable in upper and lower respiratory specimens during the acute phase of infection. The lowest concentration of SARS-CoV-2 viral copies this assay can detect is 250 copies / mL. A negative result does not preclude SARS-CoV-2 infection and should not be used as the sole basis for treatment or other patient management decisions.  A negative result may occur with improper specimen collection /  handling, submission of specimen other than nasopharyngeal swab, presence of viral mutation(s) within the areas targeted by this assay, and inadequate number of viral copies (<250 copies / mL). A negative result must be combined with clinical observations, patient history, and epidemiological information.  Fact Sheet for Patients:   BoilerBrush.com.cy  Fact Sheet for Healthcare Providers: https://pope.com/  This test is not yet approved or  cleared by the [place] FDA and has been authorized for detection and/or diagnosis of SARS-CoV-2 by FDA under an Emergency Use Authorization (EUA).  This EUA will remain in effect (meaning this test can be used) for the duration of the COVID-19 declaration under Section 564(b)(1) of the Act, 21 U.S.C. section 360bbb-3(b)(1), unless the authorization is terminated or revoked sooner.  Performed at [facility], [address] [place], [place] [address]          Radiology Studies: EEG  Result Date: 12/18/2019 Beryle Beams, MD     12/18/2019  6:00 PM [facility] Kofi [name], MD     [email or web address]       HISTORY: This is a 64 year old who presents with  altered mental status. This study is being done to evaluate for nonconvulsive seizures. MEDICATIONS: Current Facility-Administered Medications: .   stroke: mapping our early stages of recovery book, , Does not apply, Once, Adefeso, Oladapo, DO .  aspirin chewable tablet 81 mg, 81 mg, Oral, Daily, Sherryll Burger, Zarif Rathje D, DO .  simvastatin (ZOCOR) tablet 40 mg, 40 mg, Oral, q1800, Sherryll Burger, Londen Lorge D, DO ANALYSIS: A 16 channel recording using standard 10 20 measurements is conducted for 24 minutes.  There is a posterior dominant rhythm that gets as high as 7-8 hertz. There is beta activity observed in the frontal areas. The recording transitions to drowsiness. There is episodic bilateral theta slowing seen in the temporal areas. Photic stimulation and hyperventilation are not carried out. There is no focal or lateral slowing. There is no epileptiform activities noted. IMPRESSION: 1. This recording of the awake and drowsy state shows mild global slowing indicating a mild global encephalopathy. However no epileptiform activities are noted. Kofi [name], M.D. Diplomate, Biomedical engineer of Psychiatry and Neurology ( Neurology).   CT Code Stroke CTA Head W/WO contrast  Result Date: 12/17/2019 CLINICAL DATA:  Initial evaluation for possible stroke, slurred speech, left-sided facial droop. EXAM: CT ANGIOGRAPHY HEAD AND NECK CT PERFUSION BRAIN TECHNIQUE: Multidetector CT imaging of the head and neck was performed using the standard protocol during bolus administration of intravenous contrast. Multiplanar CT image reconstructions and MIPs were obtained to evaluate the vascular anatomy. Carotid stenosis measurements (when applicable) are obtained utilizing NASCET criteria, using the distal internal carotid diameter as the denominator. Multiphase CT imaging of the brain was performed following IV bolus contrast injection. Subsequent parametric perfusion maps were calculated using RAPID software. CONTRAST:  OMNIPAQUE IOHEXOL 350  MG/ML SOLN COMPARISON:  Prior head CT from earlier the same day. FINDINGS: CTA NECK FINDINGS Aortic arch: Visualized aortic arch of normal caliber with normal 3 vessel morphology. Mild atheromatous change seen about the arch and origin of the great vessels without hemodynamically significant stenosis. Right carotid system: Right CCA patent from its origin to the bifurcation without stenosis. Scattered atheromatous change about the right bifurcation/proximal right ICA without hemodynamically significant stenosis. Right ICA patent distally to the skull base without stenosis, dissection or occlusion. Left carotid system: Left CCA patent from its origin to the bifurcation without stenosis. Mild mixed plaque about the left bifurcation/proximal left ICA without  hemodynamically significant stenosis. Left ICA patent distally to the skull base without stenosis, dissection or occlusion. Vertebral arteries: Both vertebral arteries arise from the subclavian arteries. Right vertebral artery dominant. Mild atheromatous change at about the origin and proximal aspect of the right vertebral artery with no more than mild stenosis. Vertebral arteries otherwise widely patent without stenosis, dissection or occlusion. Skeleton: No acute osseous abnormality. No discrete or worrisome osseous lesions. Reversal of the normal cervical lordosis with mild to moderate cervical spondylosis at C4-5 through C6-7. Other neck: No other acute soft tissue abnormality within the neck. No mass lesion or adenopathy. Upper chest: Visualized upper chest demonstrates no acute finding. Emphysematous changes noted within the visualized lungs. Review of the MIP images confirms the above findings CTA HEAD FINDINGS Anterior circulation: Petrous segments widely patent bilaterally. Scattered calcified plaque within the carotid siphons with associated mild to moderate multifocal narrowing, left slightly worse than right. A1 segments widely patent. Normal anterior  communicating artery complex. Anterior cerebral arteries widely patent to their distal aspects without stenosis. No M1 stenosis or occlusion. Normal left MCA bifurcation. Right MCA trifurcations. Distal MCA branches well perfused and symmetric. Posterior circulation: Vertebral arteries patent to the vertebrobasilar junction without stenosis. Both posterior inferior cerebral arteries patent bilaterally. Minimal irregularity at the vertebrobasilar junction noted, favored to reflect mixing artifact. Basilar widely patent distally without stenosis. Superior cerebral arteries patent bilaterally. Both PCAs primarily supplied via the basilar and are well perfused to their distal aspects. Venous sinuses: Patent allowing for timing the contrast bolus. Anatomic variants: None significant.  No intracranial aneurysm. Review of the MIP images confirms the above findings CT Brain Perfusion Findings: ASPECTS: Not calculated on prior head CT. CBF (<30%) Volume: 66mL Perfusion (Tmax>6.0s) volume: 83mL Mismatch Volume: 83mL Infarction Location:Negative CT perfusion for acute core infarct. Apparent 15 cc area of delayed perfusion at the right occipital region, right PCA distribution. No parenchymal changes seen within this region on prior head CT. No right PCA or posterior circulation stenosis to account for this finding. Finding of uncertain significance, but could reflect an area of evolving ischemia. IMPRESSION: CTA HEAD AND NECK IMPRESSION: 1. Negative CTA for emergent large vessel occlusion. 2. Mild-to-moderate atheromatous change about the carotid siphons with associated mild to moderate multifocal narrowing. 3. Additional mild atheromatous change elsewhere about the major arterial vasculature of the head and neck as above. No other hemodynamically significant or correctable stenosis. 4. Aortic Atherosclerosis (ICD10-I70.0) and Emphysema (ICD10-J43.9). CT PERFUSION IMPRESSION: 1. Negative CT perfusion for acute core infarct. 2.  15 cc area of delayed perfusion at the right occipital lobe, right PCA distribution, of uncertain significance, but could reflect an area of evolving ischemia. Correlation with dedicated MRI could be performed for further evaluation as warranted. Electronically Signed   By: Rise Mu M.D.   On: 12/17/2019 23:08   CT HEAD WO CONTRAST  Result Date: 12/17/2019 CLINICAL DATA:  64 year old who fell while outside at home earlier today. When found by the family, the patient was confused, had an unsteady gait, slurred speech and possible LEFT facial droop. EXAM: CT HEAD WITHOUT CONTRAST TECHNIQUE: Contiguous axial images were obtained from the base of the skull through the vertex without intravenous contrast. COMPARISON:  02/14/2014. FINDINGS: Brain: Moderate cortical and deep atrophy, progressive since 2015. Severe changes of small vessel disease of the white matter diffusely, also progressive. No mass lesion. No midline shift. No acute hemorrhage or hematoma. No extra-axial fluid collections. No evidence of acute infarction. Vascular: Moderate BILATERAL carotid  siphon atherosclerosis. No hyperdense vessel. Skull: No skull fracture or other focal osseous abnormality involving the skull. Sinuses/Orbits: Visualized paranasal sinuses, bilateral mastoid air cells and bilateral middle ear cavities well-aerated. Visualized orbits and globes normal in appearance. Other: None. IMPRESSION: 1. No acute intracranial abnormality. 2. Moderate generalized atrophy and severe chronic microvascular ischemic changes of the white matter, progressive since 2015. Electronically Signed   By: Hulan Saas M.D.   On: 12/17/2019 17:47   CT Code Stroke CTA Neck W/WO contrast  Result Date: 12/17/2019 CLINICAL DATA:  Initial evaluation for possible stroke, slurred speech, left-sided facial droop. EXAM: CT ANGIOGRAPHY HEAD AND NECK CT PERFUSION BRAIN TECHNIQUE: Multidetector CT imaging of the head and neck was performed using  the standard protocol during bolus administration of intravenous contrast. Multiplanar CT image reconstructions and MIPs were obtained to evaluate the vascular anatomy. Carotid stenosis measurements (when applicable) are obtained utilizing NASCET criteria, using the distal internal carotid diameter as the denominator. Multiphase CT imaging of the brain was performed following IV bolus contrast injection. Subsequent parametric perfusion maps were calculated using RAPID software. CONTRAST:  OMNIPAQUE IOHEXOL 350 MG/ML SOLN COMPARISON:  Prior head CT from earlier the same day. FINDINGS: CTA NECK FINDINGS Aortic arch: Visualized aortic arch of normal caliber with normal 3 vessel morphology. Mild atheromatous change seen about the arch and origin of the great vessels without hemodynamically significant stenosis. Right carotid system: Right CCA patent from its origin to the bifurcation without stenosis. Scattered atheromatous change about the right bifurcation/proximal right ICA without hemodynamically significant stenosis. Right ICA patent distally to the skull base without stenosis, dissection or occlusion. Left carotid system: Left CCA patent from its origin to the bifurcation without stenosis. Mild mixed plaque about the left bifurcation/proximal left ICA without hemodynamically significant stenosis. Left ICA patent distally to the skull base without stenosis, dissection or occlusion. Vertebral arteries: Both vertebral arteries arise from the subclavian arteries. Right vertebral artery dominant. Mild atheromatous change at about the origin and proximal aspect of the right vertebral artery with no more than mild stenosis. Vertebral arteries otherwise widely patent without stenosis, dissection or occlusion. Skeleton: No acute osseous abnormality. No discrete or worrisome osseous lesions. Reversal of the normal cervical lordosis with mild to moderate cervical spondylosis at C4-5 through C6-7. Other neck: No other  acute soft tissue abnormality within the neck. No mass lesion or adenopathy. Upper chest: Visualized upper chest demonstrates no acute finding. Emphysematous changes noted within the visualized lungs. Review of the MIP images confirms the above findings CTA HEAD FINDINGS Anterior circulation: Petrous segments widely patent bilaterally. Scattered calcified plaque within the carotid siphons with associated mild to moderate multifocal narrowing, left slightly worse than right. A1 segments widely patent. Normal anterior communicating artery complex. Anterior cerebral arteries widely patent to their distal aspects without stenosis. No M1 stenosis or occlusion. Normal left MCA bifurcation. Right MCA trifurcations. Distal MCA branches well perfused and symmetric. Posterior circulation: Vertebral arteries patent to the vertebrobasilar junction without stenosis. Both posterior inferior cerebral arteries patent bilaterally. Minimal irregularity at the vertebrobasilar junction noted, favored to reflect mixing artifact. Basilar widely patent distally without stenosis. Superior cerebral arteries patent bilaterally. Both PCAs primarily supplied via the basilar and are well perfused to their distal aspects. Venous sinuses: Patent allowing for timing the contrast bolus. Anatomic variants: None significant.  No intracranial aneurysm. Review of the MIP images confirms the above findings CT Brain Perfusion Findings: ASPECTS: Not calculated on prior head CT. CBF (<30%) Volume: 21mL Perfusion (  Tmax>6.0s) volume: 94mL Mismatch Volume: 21mL Infarction Location:Negative CT perfusion for acute core infarct. Apparent 15 cc area of delayed perfusion at the right occipital region, right PCA distribution. No parenchymal changes seen within this region on prior head CT. No right PCA or posterior circulation stenosis to account for this finding. Finding of uncertain significance, but could reflect an area of evolving ischemia. IMPRESSION: CTA HEAD  AND NECK IMPRESSION: 1. Negative CTA for emergent large vessel occlusion. 2. Mild-to-moderate atheromatous change about the carotid siphons with associated mild to moderate multifocal narrowing. 3. Additional mild atheromatous change elsewhere about the major arterial vasculature of the head and neck as above. No other hemodynamically significant or correctable stenosis. 4. Aortic Atherosclerosis (ICD10-I70.0) and Emphysema (ICD10-J43.9). CT PERFUSION IMPRESSION: 1. Negative CT perfusion for acute core infarct. 2. 15 cc area of delayed perfusion at the right occipital lobe, right PCA distribution, of uncertain significance, but could reflect an area of evolving ischemia. Correlation with dedicated MRI could be performed for further evaluation as warranted. Electronically Signed   By: Rise Mu M.D.   On: 12/17/2019 23:08   MR BRAIN WO CONTRAST  Result Date: 12/18/2019 CLINICAL DATA:  Slurred speech and left-sided facial droop. Confusion. EXAM: MRI HEAD WITHOUT CONTRAST TECHNIQUE: Multiplanar, multiecho pulse sequences of the brain and surrounding structures were obtained without intravenous contrast. COMPARISON:  Head CT, CTA, and CTP 12/17/2019 FINDINGS: The coronal T2 sequence is severely motion degraded, and there is milder motion on other sequences. Brain: There is a 1 cm acute infarct in the left paramedian pons, and there is an acute 4 mm infarct in the cerebellar vermis. Additional subcentimeter acute to early subacute infarcts are questioned in the superior aspect of the left cerebellar hemisphere, right occipital lobe, and left temporo-occipital junction. 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 Patchy T2 hyperintensities in the cerebral white matter bilaterally and in the pons are nonspecific but compatible with extensive chronic small vessel ischemic disease. Small chronic infarcts are noted in the  right middle cerebellar peduncle, pons, thalami, basal ganglia, and right corona radiata. There is mild cerebral atrophy. No mass, midline shift, or extra-axial fluid collection is identified. Vascular: Major intracranial vascular flow voids are preserved. Skull and upper cervical spine: Unremarkable bone marrow signal. Sinuses/Orbits: Unremarkable orbits. Minimal mucosal thickening in the right maxillary sinus. Clear mastoid air cells. Other: None. IMPRESSION: 1. Small acute posterior circulation infarcts most notable in the pons. 2. Extensive chronic small vessel ischemic disease with multiple chronic infarcts. 3. Numerous chronic microhemorrhages likely related to chronic hypertension. Electronically Signed   By: Sebastian Ache M.D.   On: 12/18/2019 09:22   CT Code Stroke Cerebral Perfusion with contrast  Result Date: 12/17/2019 CLINICAL DATA:  Initial evaluation for possible stroke, slurred speech, left-sided facial droop. EXAM: CT ANGIOGRAPHY HEAD AND NECK CT PERFUSION BRAIN TECHNIQUE: Multidetector CT imaging of the head and neck was performed using the standard protocol during bolus administration of intravenous contrast. Multiplanar CT image reconstructions and MIPs were obtained to evaluate the vascular anatomy. Carotid stenosis measurements (when applicable) are obtained utilizing NASCET criteria, using the distal internal carotid diameter as the denominator. Multiphase CT imaging of the brain was performed following IV bolus contrast injection. Subsequent parametric perfusion maps were calculated using RAPID software. CONTRAST:  OMNIPAQUE IOHEXOL 350 MG/ML SOLN COMPARISON:  Prior head CT from earlier the same day. FINDINGS: CTA NECK FINDINGS Aortic arch: Visualized aortic arch of  normal caliber with normal 3 vessel morphology. Mild atheromatous change seen about the arch and origin of the great vessels without hemodynamically significant stenosis. Right carotid system: Right CCA patent from its  origin to the bifurcation without stenosis. Scattered atheromatous change about the right bifurcation/proximal right ICA without hemodynamically significant stenosis. Right ICA patent distally to the skull base without stenosis, dissection or occlusion. Left carotid system: Left CCA patent from its origin to the bifurcation without stenosis. Mild mixed plaque about the left bifurcation/proximal left ICA without hemodynamically significant stenosis. Left ICA patent distally to the skull base without stenosis, dissection or occlusion. Vertebral arteries: Both vertebral arteries arise from the subclavian arteries. Right vertebral artery dominant. Mild atheromatous change at about the origin and proximal aspect of the right vertebral artery with no more than mild stenosis. Vertebral arteries otherwise widely patent without stenosis, dissection or occlusion. Skeleton: No acute osseous abnormality. No discrete or worrisome osseous lesions. Reversal of the normal cervical lordosis with mild to moderate cervical spondylosis at C4-5 through C6-7. Other neck: No other acute soft tissue abnormality within the neck. No mass lesion or adenopathy. Upper chest: Visualized upper chest demonstrates no acute finding. Emphysematous changes noted within the visualized lungs. Review of the MIP images confirms the above findings CTA HEAD FINDINGS Anterior circulation: Petrous segments widely patent bilaterally. Scattered calcified plaque within the carotid siphons with associated mild to moderate multifocal narrowing, left slightly worse than right. A1 segments widely patent. Normal anterior communicating artery complex. Anterior cerebral arteries widely patent to their distal aspects without stenosis. No M1 stenosis or occlusion. Normal left MCA bifurcation. Right MCA trifurcations. Distal MCA branches well perfused and symmetric. Posterior circulation: Vertebral arteries patent to the vertebrobasilar junction without stenosis. Both  posterior inferior cerebral arteries patent bilaterally. Minimal irregularity at the vertebrobasilar junction noted, favored to reflect mixing artifact. Basilar widely patent distally without stenosis. Superior cerebral arteries patent bilaterally. Both PCAs primarily supplied via the basilar and are well perfused to their distal aspects. Venous sinuses: Patent allowing for timing the contrast bolus. Anatomic variants: None significant.  No intracranial aneurysm. Review of the MIP images confirms the above findings CT Brain Perfusion Findings: ASPECTS: Not calculated on prior head CT. CBF (<30%) Volume: 0mL Perfusion (Tmax>6.0s) volume: 15mL Mismatch Volume: 15mL Infarction Location:Negative CT perfusion for acute core infarct. Apparent 15 cc area of delayed perfusion at the right occipital region, right PCA distribution. No parenchymal changes seen within this region on prior head CT. No right PCA or posterior circulation stenosis to account for this finding. Finding of uncertain significance, but could reflect an area of evolving ischemia. IMPRESSION: CTA HEAD AND NECK IMPRESSION: 1. Negative CTA for emergent large vessel occlusion. 2. Mild-to-moderate atheromatous change about the carotid siphons with associated mild to moderate multifocal narrowing. 3. Additional mild atheromatous change elsewhere about the major arterial vasculature of the head and neck as above. No other hemodynamically significant or correctable stenosis. 4. Aortic Atherosclerosis (ICD10-I70.0) and Emphysema (ICD10-J43.9). CT PERFUSION IMPRESSION: 1. Negative CT perfusion for acute core infarct. 2. 15 cc area of delayed perfusion at the right occipital lobe, right PCA distribution, of uncertain significance, but could reflect an area of evolving ischemia. Correlation with dedicated MRI could be performed for further evaluation as warranted. Electronically Signed   By: Rise Mu M.D.   On: 12/17/2019 23:08   ECHOCARDIOGRAM  COMPLETE  Result Date: 12/18/2019    ECHOCARDIOGRAM REPORT   Patient Name:   Alec Watson Date of Exam: 12/18/2019  Medical Rec #:  [id number]          Height:       67.0 in Accession #:    [id number]         Weight:       148.8 lb Date of Birth:  09/15/1955           BSA:          1.783 m Patient Age:    64 years           BP:           188/92 mmHg Patient Gender: M                  HR:           49 bpm. [facility] Procedure: 2D Echo, Cardiac Doppler and Color Doppler Indications:    Stroke 434.91 / I163.9  History:        Patient has no prior history of Echocardiogram examinations.                 Risk Factors:Hypertension. Acute CVA (cerebrovascular accident)                 this admit.  Sonographer:    Celesta GentileBernard White RCS Referring Phys: [id number] OLADAPO ADEFESO IMPRESSIONS  1. Left ventricular ejection fraction, by estimation, is 60 to 65%. The left ventricle has normal function. The left ventricle has no regional wall motion abnormalities. There is moderate left ventricular hypertrophy. Left ventricular diastolic parameters are consistent with Grade I diastolic dysfunction (impaired relaxation).  2. Right ventricular systolic function is normal. The right ventricular size is normal.  3. Left atrial size was mildly dilated.  4. The mitral valve is normal in structure. Mild to moderate mitral valve regurgitation. No evidence of mitral stenosis.  5. The aortic valve has an indeterminant number of cusps. There is moderate calcification of the aortic valve. There is moderate thickening of the aortic valve. Aortic valve regurgitation is not visualized. No aortic stenosis is present.  6. The inferior vena cava is normal in size with greater than 50% respiratory variability, suggesting right atrial pressure of 3 mmHg. FINDINGS  Left Ventricle: Left ventricular ejection fraction, by estimation, is 60 to 65%. The left ventricle has normal function. The left ventricle has no regional wall motion  abnormalities. The left ventricular internal cavity size was normal in size. There is  moderate left ventricular hypertrophy. Left ventricular diastolic parameters are consistent with Grade I diastolic dysfunction (impaired relaxation). Normal left ventricular filling pressure. Right Ventricle: The right ventricular size is normal. No increase in right ventricular wall thickness. Right ventricular systolic function is normal. Left Atrium: Left atrial size was mildly dilated. Right Atrium: Right atrial size was normal in size. Pericardium: There is no evidence of pericardial effusion. Mitral Valve: The mitral valve is normal in structure. Mild to moderate mitral valve regurgitation. No evidence of mitral valve stenosis. Tricuspid Valve: The tricuspid valve is normal in structure. Tricuspid valve regurgitation is mild . No evidence of tricuspid stenosis. Aortic Valve: The aortic valve has an indeterminant number of cusps. There is moderate calcification of the aortic valve. There is moderate thickening of the aortic valve. There is moderate aortic valve annular calcification. Aortic valve regurgitation is not visualized. No aortic stenosis is present. Aortic valve mean gradient measures 5.3 mmHg. Aortic valve peak gradient measures 12.2 mmHg. Aortic valve area, by VTI measures 2.07 cm. Pulmonic Valve: The pulmonic  valve was not well visualized. Pulmonic valve regurgitation is not visualized. No evidence of pulmonic stenosis. Aorta: The aortic root is normal in size and structure. Pulmonary Artery: Indeterminant PASP, inadequate TR jet. Venous: The inferior vena cava is normal in size with greater than 50% respiratory variability, suggesting right atrial pressure of 3 mmHg. IAS/Shunts: No atrial level shunt detected by color flow Doppler.  LEFT VENTRICLE PLAX 2D LVIDd:         4.79 cm  Diastology LVIDs:         3.41 cm  LV e' medial:    6.74 cm/s LV PW:         1.31 cm  LV E/e' medial:  9.5 LV IVS:        1.22 cm  LV  e' lateral:   7.29 cm/s LVOT diam:     2.00 cm  LV E/e' lateral: 8.8 LV SV:         75 LV SV Index:   42 LVOT Area:     3.14 cm  RIGHT VENTRICLE RV S prime:     11.90 cm/s TAPSE (M-mode): 2.0 cm LEFT ATRIUM             Index       RIGHT ATRIUM           Index LA diam:        4.60 cm 2.58 cm/m  RA Area:     15.80 cm LA Vol (A2C):   75.0 ml 42.05 ml/m RA Volume:   42.00 ml  23.55 ml/m LA Vol (A4C):   53.4 ml 29.94 ml/m LA Biplane Vol: 67.5 ml 37.85 ml/m  AORTIC VALVE AV Area (Vmax):    2.02 cm AV Area (Vmean):   1.87 cm AV Area (VTI):     2.07 cm AV Vmax:           174.52 cm/s AV Vmean:          106.786 cm/s AV VTI:            0.361 m AV Peak Grad:      12.2 mmHg AV Mean Grad:      5.3 mmHg LVOT Vmax:         112.00 cm/s LVOT Vmean:        63.400 cm/s LVOT VTI:          0.238 m LVOT/AV VTI ratio: 0.66  AORTA Ao Root diam: 3.10 cm MITRAL VALVE                 TRICUSPID VALVE MV Area (PHT): 2.39 cm      TR Peak grad:   18.1 mmHg MV Decel Time: 317 msec      TR Vmax:        213.00 cm/s MR Peak grad:    164.9 mmHg MR Mean grad:    84.0 mmHg   SHUNTS MR Vmax:         642.00 cm/s Systemic VTI:  0.24 m MR Vmean:        415.0 cm/s  Systemic Diam: 2.00 cm MR PISA:         3.08 cm MR PISA Eff ROA: 12 mm MR PISA Radius:  0.70 cm MV E velocity: 64.30 cm/s MV A velocity: 101.00 cm/s MV E/A ratio:  0.64 Dina Rich MD Electronically signed by Dina Rich MD Signature Date/Time: 12/18/2019/3:56:54 PM    Final         Scheduled Meds: .  stroke:  mapping our early stages of recovery book   Does not apply Once  . aspirin  81 mg Oral Daily  . simvastatin  40 mg Oral q1800   Continuous Infusions: . dextrose 5 % and 0.45% NaCl 50 mL/hr at 12/19/19 1112     LOS: 1 day    Time spent: 30 minutes    Breianna Delfino Hoover Brunette, DO [facility]  If 7PM-7AM, please contact night-coverage [email or web address] 12/19/2019, 3:14 PM

## 2019-12-19 NOTE — TOC Initial Note (Signed)
Transition of Care [facility]) - Initial/Assessment Note    Patient Details  Name: Alec Watson MRN: [id number] Date of Birth: 20-Jul-1955  [facility]) CM/SW Contact:    Barry Brunner, LCSW Phone Number: 12/19/2019, 5:47 PM  Clinical Narrative:                 Patient is a 64 year old male admitted for Acute CVA. CSW spoke with patient's daughter to discuss SNF referrals. Patient's daughter reported that she is agreeable to [facility]. CSW completed FL2, received PASRR  placed referral to local SNF. TOC to follow.        Patient Goals and CMS Choice        Expected Discharge Plan and Services                                                Prior Living Arrangements/Services                       Activities of Daily Living Home Assistive Devices/Equipment: Cane (specify quad or straight) ADL Screening (condition at time of admission) Patient's cognitive ability adequate to safely complete daily activities?: Yes Is the patient deaf or have difficulty hearing?: No Does the patient have difficulty seeing, even when wearing glasses/contacts?: No Does the patient have difficulty concentrating, remembering, or making decisions?: No Patient able to express need for assistance with ADLs?: Yes Does the patient have difficulty dressing or bathing?: Yes (help needed with ADL's at this time) Independently performs ADLs?: No Communication: Independent Does the patient have difficulty walking or climbing stairs?: Yes (gait unsteady at this time) Weakness of Legs: Right Weakness of Arms/Hands: Right  Permission Sought/Granted                  Emotional Assessment              Admission diagnosis:  CVA (cerebral vascular accident) (HCC) [I63.9] Acute CVA (cerebrovascular accident) (HCC) [I63.9] Cerebrovascular accident (CVA), unspecified mechanism (HCC) [I63.9] Patient Active Problem List   Diagnosis Date Noted  . Essential  hypertension 12/18/2019  . Hypoalbuminemia 12/18/2019  . Microcytic anemia 12/18/2019  . CVA (cerebral vascular accident) (HCC) 12/18/2019  . Acute CVA (cerebrovascular accident) (HCC) 12/17/2019   PCP:  Patient, No Pcp Per Pharmacy:   [id number] - [place], [place] - [address] SEC [address] [place] [address] Phone: [phone] Fax: [phone]     Social Determinants of Health (SDOH) Interventions    Readmission Risk Interventions No flowsheet data found.

## 2019-12-20 ENCOUNTER — Inpatient Hospital Stay (HOSPITAL_COMMUNITY): Payer: Medicaid Other

## 2019-12-20 LAB — CBC
HCT: 37.7 % — ABNORMAL LOW (ref 39.0–52.0)
Hemoglobin: 12.4 g/dL — ABNORMAL LOW (ref 13.0–17.0)
MCH: 25.4 pg — ABNORMAL LOW (ref 26.0–34.0)
MCHC: 32.9 g/dL (ref 30.0–36.0)
MCV: 77.3 fL — ABNORMAL LOW (ref 80.0–100.0)
Platelets: 232 10*3/uL (ref 150–400)
RBC: 4.88 MIL/uL (ref 4.22–5.81)
RDW: 13.2 % (ref 11.5–15.5)
WBC: 6.8 10*3/uL (ref 4.0–10.5)
nRBC: 0 % (ref 0.0–0.2)

## 2019-12-20 LAB — BASIC METABOLIC PANEL
Anion gap: 8 (ref 5–15)
BUN: 10 mg/dL (ref 8–23)
CO2: 22 mmol/L (ref 22–32)
Calcium: 9.1 mg/dL (ref 8.9–10.3)
Chloride: 108 mmol/L (ref 98–111)
Creatinine, Ser: 0.85 mg/dL (ref 0.61–1.24)
GFR calc Af Amer: >60 mL/min (ref >60)
GFR calc non Af Amer: >60 mL/min (ref >60)
Glucose, Bld: 103 mg/dL — ABNORMAL HIGH (ref 70–99)
Potassium: 3.6 mmol/L (ref 3.5–5.1)
Sodium: 138 mmol/L (ref 135–145)

## 2019-12-20 LAB — MAGNESIUM: Magnesium: 2.2 mg/dL (ref 1.7–2.4)

## 2019-12-20 MED ORDER — METOPROLOL TARTRATE 5 MG/5ML IV SOLN: 5.0000 mg | Freq: Four times a day (QID) | INTRAVENOUS | Status: DC | PRN

## 2019-12-20 MED ORDER — ATORVASTATIN CALCIUM 20 MG PO TABS
20.0000 mg | ORAL_TABLET | Freq: Every day | ORAL | Status: DC
  Administered 2019-12-21 – 2019-12-25 (×5): 20 mg via ORAL
  Filled 2019-12-20 (×5): qty 1

## 2019-12-20 MED ORDER — LOSARTAN POTASSIUM 50 MG PO TABS
50.0000 mg | ORAL_TABLET | Freq: Every day | ORAL | Status: DC
  Administered 2019-12-20 – 2019-12-25 (×6): 50 mg via ORAL
  Filled 2019-12-20 (×6): qty 1

## 2019-12-20 MED ORDER — AMLODIPINE BESYLATE 5 MG PO TABS
5.0000 mg | ORAL_TABLET | Freq: Every day | ORAL | Status: DC
  Administered 2019-12-20 – 2019-12-25 (×6): 5 mg via ORAL
  Filled 2019-12-20 (×6): qty 1

## 2019-12-20 NOTE — Progress Notes (Signed)
PROGRESS NOTE    Alec Watson  DTO:[id number] DOB: 29-Dec-1955 DOA: 12/17/2019 PCP: Patient, No Pcp Per   Brief Narrative:  Per HPI: Weber Cooks Gallowayis a 64 y.o.malewith medical history significant forhypertension who presents to the emergency department via EMS. Patient was unable to provide history due to speech difficulty. History was obtained from ED physician and from ED medical record. Per report, family noted patient to be confused and unsteady on his feet, so EMS was activated,last known well time was possibly in the morning, he had falling in the yard and was presumed to be laying there for about an hour prior to being noted by family.Hewas said to complain of generalized weakness.  9/13: Patient admitted with speech difficulty, generalized weakness and unsteadiness with concern for CVA.  Brain MRI with acute CVA to pons region noted with 2D echocardiogram as well as neurology on consultation ordered and pending.  PT evaluation pending, but patient may very likely require SNF as he appears to have significant deficits.  9/14: Patient recommended for SNF.  2D echocardiogram without significant abnormalities.  He continues have significant difficulty with swallowing and SLP will pursue MBS.  Neurology evaluation pending.  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    Essential hypertension -poorly controlled, added amlodipine and losartan, metoprolol as needed -follow and titrate as needed.   Hypoalbuminemia -Supplementation once patient can tolerate p.o. -Albumin 3.3  Microcytic anemia-stable -Iron studies within normal limits -Continue to follow CBC   DVT prophylaxis: SCDs Code Status: Full Family Communication: Spoke with sister on phone 9/14 Disposition Plan:  Status is: Inpatient  Remains inpatient appropriate because:Ongoing diagnostic testing needed not appropriate for outpatient work up and Inpatient level of care appropriate due to severity of illness  Dispo: The patient is from: Home              Anticipated d/c is to: SNF              Anticipated d/c date is: 1 day              Patient currently is not medically stable to d/c.  Consultants:   Neurology  Procedures:   See below  Antimicrobials:   None  Subjective: Patient sitting up in chair eating breakfast.  Objective: Vitals:   12/19/19 2216 12/20/19 0243 12/20/19 0618 12/20/19 1157  BP: (!) 189/108 (!) 183/146 (!) 170/95 (!) 180/89  Pulse: 74 76 (!) 58 67  Resp: 20 20 18 18   Temp: 98.8 F (37.1 C) 98.7 F (37.1 C) 98.4 F (36.9 C)   TempSrc: Oral Oral Oral   SpO2: 97% (!) 86% 100% 100%  Weight:      Height:        Intake/Output Summary (Last 24 hours) at 12/20/2019 1300 Last data filed at 12/19/2019 1800 Gross per 24 hour  Intake 401.14 ml  Output 650 ml  Net -248.86 ml  Filed Weights   12/17/19 1722 12/18/19 1417  Weight: 67.5 kg 62.9 kg   Examination:  General exam: Appears calm and comfortable  Respiratory system: Clear to auscultation. Respiratory effort normal. Cardiovascular system: normal S1 & S2 heard.  Gastrointestinal system: Abdomen is nondistended, soft and nontender.  Central nervous system: Alert and awake, nonfocal exam.  Extremities: No edema, no cyanosis, no clubbing.  Skin: No rashes, lesions or  ulcers Psychiatry: normal affect today.   Data Reviewed: I have personally reviewed following labs and imaging studies  CBC: Recent Labs  Lab 12/17/19 1813 12/18/19 0538 12/19/19 0624 12/20/19 0627  WBC 5.5 6.1 10.2 6.8  NEUTROABS 3.2  --   --   --   HGB 11.7* 12.3* 13.0 12.4*  HCT 35.9* 38.1* 40.1 37.7*  MCV 78.4* 78.1* 77.6* 77.3*  PLT 227 232 246 232   Basic Metabolic Panel: Recent Labs  Lab 12/17/19 1813 12/18/19 0538 12/19/19 0624 12/20/19 0627  NA 138 139 140 138  K 3.8 3.9 3.5 3.6  CL 108 107 108 108  CO2 25 24 22 22   GLUCOSE 108* 95 110* 103*  BUN 10 9 9 10   CREATININE 0.83 0.81 0.87 0.85  CALCIUM 8.8* 9.1 9.3 9.1  MG  --  2.2 2.1 2.2  PHOS  --  3.5  --   --    GFR: Estimated Creatinine Clearance: 78.1 mL/min (by C-G formula based on SCr of 0.85 mg/dL). Liver Function Tests: Recent Labs  Lab 12/17/19 1813 12/18/19 0538  AST 14* 14*  ALT 9 11  ALKPHOS 61 52  BILITOT 0.4 0.7  PROT 6.4* 6.3*  ALBUMIN 3.3* 3.3*   No results for input(s): LIPASE, AMYLASE in the last 168 hours. No results for input(s): AMMONIA in the last 168 hours. Coagulation Profile: Recent Labs  Lab 12/17/19 1813 12/18/19 0538  INR 1.0 1.1   Cardiac Enzymes: No results for input(s): CKTOTAL, CKMB, CKMBINDEX, TROPONINI in the last 168 hours. BNP (last 3 results) No results for input(s): PROBNP in the last 8760 hours. HbA1C: Recent Labs    12/18/19 0538  HGBA1C 5.7*   CBG: Recent Labs  Lab 12/17/19 1709  GLUCAP 163*   Lipid Profile: Recent Labs    12/18/19 0538  CHOL 181  HDL 57  LDLCALC 114*  TRIG 50  CHOLHDL 3.2   Thyroid Function Tests: Recent Labs    12/18/19 1048  TSH 2.497   Anemia Panel: Recent Labs    12/18/19 0538 12/18/19 1048  VITAMINB12  --  98*  FERRITIN 31  --   TIBC 277  --   IRON 116  --    Sepsis Labs: No results for input(s): PROCALCITON, LATICACIDVEN in the last 168 hours.  Recent Results (from the past 240 hour(s))  SARS  Coronavirus 2 by RT PCR (hospital order, performed in [facility] hospital lab) Nasopharyngeal Nasopharyngeal Swab     Status: None   Collection Time: 12/17/19  5:55 PM   Specimen: Nasopharyngeal Swab  Result Value Ref Range Status   SARS Coronavirus 2 NEGATIVE NEGATIVE Final    Comment: (NOTE) SARS-CoV-2 target nucleic acids are NOT DETECTED.  The SARS-CoV-2 RNA is generally detectable in upper and lower respiratory specimens during the acute phase of infection. The lowest concentration of SARS-CoV-2 viral copies this assay can detect is 250 copies / mL. A negative result does not preclude SARS-CoV-2 infection and should not be used as the sole basis for treatment or other patient management decisions.  A negative result may occur with improper specimen collection / handling, submission of specimen other than nasopharyngeal swab, presence of viral mutation(s) within the areas targeted by this assay, and inadequate number of viral copies (<250 copies / mL). A negative result must be combined with clinical observations, patient history, and epidemiological information.  Fact Sheet for Patients:   BoilerBrush.com.cy  Fact Sheet for Healthcare Providers: https://pope.com/  This test is not yet approved or  cleared by the [place] FDA and has been authorized for detection and/or diagnosis of SARS-CoV-2 by FDA under an Emergency Use Authorization (EUA).  This EUA will remain in effect (meaning this test can be used) for the duration of the COVID-19 declaration under Section 564(b)(1) of the Act, 21 U.S.C. section 360bbb-3(b)(1), unless the authorization is terminated or revoked sooner.  Performed at [facility], [address], [place], [place] [address]    Radiology Studies: EEG  Result Date: 12/18/2019 Beryle Beams, MD     12/18/2019  6:00 PM [facility] Kofi [name], MD     [email or web address]       HISTORY:  This is a 64 year old who presents with altered mental status. This study is being done to evaluate for nonconvulsive seizures. MEDICATIONS: Current Facility-Administered Medications: .   stroke: mapping our early stages of recovery book, , Does not apply, Once, Adefeso, Oladapo, DO .  aspirin chewable tablet 81 mg, 81 mg, Oral, Daily, Sherryll Burger, Pratik D, DO .  simvastatin (ZOCOR) tablet 40 mg, 40 mg, Oral, q1800, Sherryll Burger, Pratik D, DO ANALYSIS: A 16 channel recording using standard 10 20 measurements is conducted for 24 minutes.  There is a posterior dominant rhythm that gets as high as 7-8 hertz. There is beta activity observed in the frontal areas. The recording transitions to drowsiness. There is episodic bilateral theta slowing seen in the temporal areas. Photic stimulation and hyperventilation are not carried out. There is no focal or lateral slowing. There is no epileptiform activities noted. IMPRESSION: 1. This recording of the awake and drowsy state shows mild global slowing indicating a mild global encephalopathy. However no epileptiform activities are noted. Kofi [name], M.D. Diplomate, Biomedical engineer of Psychiatry and Neurology ( Neurology).   DG Swallowing Func-Speech Pathology  Result Date: 12/20/2019 Objective Swallowing Evaluation: Type of Study: MBS-Modified Barium Swallow Study  Patient Details Name: TIA GELB MRN: [id number] Date of Birth: [birth year] Today's Date: 12/20/2019 Time: SLP Start Time (ACUTE ONLY): 1000 -SLP Stop Time (ACUTE ONLY): 1025 SLP Time Calculation (min) (ACUTE ONLY): 25 min Past Medical History: Past Medical History: Diagnosis Date . Hypertension  Past Surgical History: Past Surgical History: Procedure Laterality Date . NECK SURGERY   HPI: JADRIAN BULMAN is a 64 y.o. male with medical history significant for hypertension who presents to the emergency department via EMS. Patient was unable to provide history due to speech difficulty. History was obtained from ED  physician and from ED medical record. Per report, family noted patient to be confused and unsteady on his feet, so EMS was activated, last known well time was possibly in the morning, he had falling in the yard and was presumed to be laying there for about an hour prior to being noted by family.  He was said to complain of generalized weakness. MRI shows: There is a 1 cm acute infarct in the left paramedian pons, and there is an acute 4 mm infarct in the cerebellar vermis. Additional subcentimeter acute to early subacute infarcts are questioned in the superior  aspect of the left cerebellar hemisphere, right occipital lobe, and left temporo-occipital junction. 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 BSE and SLE ordered. Pt failed the Yale swallow screen in the ER.  Subjective: "Okay" Assessment / Plan / Recommendation CHL IP CLINICAL IMPRESSIONS 12/20/2019 Clinical Impression Pt presents with mild to mild/mod oral phase and mild pharyngeal phase dysphagia characterized by reduced lingual strength resulting in reduced bolus control/cohesion, piecemeal deglutition, premature spillage, and min oral residue; pharyngeal phase is characterized by premature spillage/delay in swallow initiation and reduced laryngeal closure resulting in variable sensed and silent aspiration in trace to min amounts with thins and NTL. Pt did well with teaspoon presentations or small cup sips of thin when cued to hold the bolus orally and then swallow. Aspiration generally occurred when taking larger sips and with straw (Pt needs cueing for small sips). Chin tuck with head turn R was ineffective. Recommend D3/mech soft and NTL via small cup sips only, no straws, po medications whole in puree, alternate solids/liquids, and trials tsp thin/cup thin water with SLP at SNF.  SLP Visit Diagnosis Dysphagia, oropharyngeal phase (R13.12) Attention and  concentration deficit following -- Frontal lobe and executive function deficit following -- Impact on safety and function Mild aspiration risk;Moderate aspiration risk   CHL IP TREATMENT RECOMMENDATION 12/20/2019 Treatment Recommendations Therapy as outlined in treatment plan below   Prognosis 12/20/2019 Prognosis for Safe Diet Advancement Good Barriers to Reach Goals (No Data) Barriers/Prognosis Comment -- CHL IP DIET RECOMMENDATION 12/20/2019 SLP Diet Recommendations Dysphagia 3 (Mech soft) solids;Nectar thick liquid Liquid Administration via Cup;No straw Medication Administration Whole meds with puree Compensations Small sips/bites;Lingual sweep for clearance of pocketing;Follow solids with liquid Postural Changes Remain semi-upright after after feeds/meals (Comment);Seated upright at 90 degrees   CHL IP OTHER RECOMMENDATIONS 12/20/2019 Recommended Consults -- Oral Care Recommendations Oral care BID;Staff/trained caregiver to provide oral care Other Recommendations Prohibited food (jello, ice cream, thin soups);Order thickener from pharmacy;Clarify dietary restrictions   CHL IP FOLLOW UP RECOMMENDATIONS 12/20/2019 Follow up Recommendations Skilled Nursing facility   [facility] IP FREQUENCY AND DURATION 12/20/2019 Speech Therapy Frequency (ACUTE ONLY) min 2x/week Treatment Duration 2 weeks      CHL IP ORAL PHASE 12/20/2019 Oral Phase Impaired Oral - Pudding Teaspoon -- Oral - Pudding Cup -- Oral - Honey Teaspoon -- Oral - Honey Cup -- Oral - Nectar Teaspoon -- Oral - Nectar Cup Incomplete tongue to palate contact;Lingual/palatal residue Oral - Nectar Straw WFL Oral - Thin Teaspoon WFL;Premature spillage Oral - Thin Cup Premature spillage;Lingual/palatal residue;Decreased bolus cohesion Oral - Thin Straw -- Oral - Puree Weak lingual manipulation;Lingual/palatal residue;Piecemeal swallowing;Decreased bolus cohesion Oral - Mech Soft -- Oral - Regular Delayed oral transit;Piecemeal swallowing;Lingual/palatal residue;Decreased bolus  cohesion Oral - Multi-Consistency -- Oral - Pill Premature spillage Oral Phase - Comment --  CHL IP PHARYNGEAL PHASE 12/20/2019 Pharyngeal Phase Impaired Pharyngeal- Pudding Teaspoon -- Pharyngeal -- Pharyngeal- Pudding Cup -- Pharyngeal -- Pharyngeal- Honey Teaspoon -- Pharyngeal -- Pharyngeal- Honey Cup -- Pharyngeal -- Pharyngeal- Nectar Teaspoon -- Pharyngeal -- Pharyngeal- Nectar Cup Delayed swallow initiation-pyriform sinuses Pharyngeal -- Pharyngeal- Nectar Straw Delayed swallow initiation-pyriform sinuses;Reduced airway/laryngeal closure;Penetration/Aspiration during swallow;Penetration/Apiration after swallow;Trace aspiration Pharyngeal Material enters airway, passes BELOW cords and not ejected out despite cough attempt by patient Pharyngeal- Thin Teaspoon Delayed swallow initiation-pyriform sinuses;Reduced airway/laryngeal closure;Penetration/Aspiration during swallow;Trace aspiration Pharyngeal Material does not enter airway;Material enters airway, passes BELOW cords and not ejected out despite cough  attempt by patient Pharyngeal- Thin Cup Delayed swallow initiation-pyriform sinuses;Reduced airway/laryngeal closure;Penetration/Aspiration during swallow;Penetration/Apiration after swallow;Trace aspiration;Moderate aspiration Pharyngeal Material enters airway, CONTACTS cords and not ejected out;Material enters airway, passes BELOW cords and not ejected out despite cough attempt by patient Pharyngeal- Thin Straw -- Pharyngeal -- Pharyngeal- Puree Delayed swallow initiation-vallecula;Delayed swallow initiation-pyriform sinuses Pharyngeal -- Pharyngeal- Mechanical Soft -- Pharyngeal -- Pharyngeal- Regular Delayed swallow initiation-pyriform sinuses Pharyngeal -- Pharyngeal- Multi-consistency -- Pharyngeal -- Pharyngeal- Pill (No Data) Pharyngeal -- Pharyngeal Comment --  CHL IP CERVICAL ESOPHAGEAL PHASE 12/20/2019 Cervical Esophageal Phase WFL Pudding Teaspoon -- Pudding Cup -- Honey Teaspoon -- Honey Cup --  Nectar Teaspoon -- Nectar Cup -- Nectar Straw -- Thin Teaspoon -- Thin Cup -- Thin Straw -- Puree -- Mechanical Soft -- Regular -- Multi-consistency -- Pill -- Cervical Esophageal Comment -- Thank you, Havery Moros, CCC-SLP [phone] PORTER,DABNEY 12/20/2019, 11:31 AM              Scheduled Meds: .  stroke: mapping our early stages of recovery book   Does not apply Once  . aspirin  81 mg Oral Daily  . simvastatin  40 mg Oral q1800   Continuous Infusions:   LOS: 2 days   Time spent: 30 minutes  Yohance Hathorne Laural Benes, MD How to contact the [facility] Attending or Consulting provider 7A - 7P or covering provider during after hours 7P -7A, for this patient?  1. Check the care team in [facility] and look for a) attending/consulting TRH provider listed and b) the [facility] team listed 2. Log into [email or web address] and use [facility]'s universal password to access. If you do not have the password, please contact the hospital operator. 3. Locate the [facility] provider you are looking for under [facility] and page to a number that you can be directly reached. 4. If you still have difficulty reaching the provider, please page the [facility] (Director on Call) for the Hospitalists listed on amion for assistance.   If 7PM-7AM, please contact night-coverage [email or web address] 12/20/2019, 1:00 PM

## 2019-12-20 NOTE — Progress Notes (Signed)
Physical Therapy Treatment Patient Details Name: Alec Watson MRN: [id number] DOB: [birth year] Today's Date: 12/20/2019    History of Present Illness Alec Watson is a 64 y.o. male with medical history significant for hypertension who presents to the emergency department via EMS. Patient was unable to provide history due to speech difficulty. History was obtained from ED physician and from ED medical record. Per report, family noted patient to be confused and unsteady on his feet, so EMS was activated, last known well time was possibly in the morning, he had falling in the yard and was presumed to be laying there for about an hour prior to being noted by family.  He was said to complain of generalized weakness.    PT Comments    Patient present seated in chair (assisted by nursing staff) and agreeable for therapy.  Patient had no episodes of clonus like movement in RLE during treatment and improvement in strength coordination of right side, increased endurance/distance for taking steps, but continues to have difficulty with right heel to toe stepping due to limited ankle dorsiflexion and fair/poor coordination.  Patient tolerated staying up in chair after therapy.  Patient will benefit from continued physical therapy in hospital and recommended venue below to increase strength, balance, endurance for safe ADLs and gait.    Follow Up Recommendations  SNF     Equipment Recommendations  Rolling walker with 5" wheels    Recommendations for Other Services       Precautions / Restrictions Precautions Precautions: Fall Restrictions Weight Bearing Restrictions: No    Mobility  Bed Mobility               General bed mobility comments: Patient presents up in chair (assisted by nursing staff)  Transfers Overall transfer level: Needs assistance Equipment used: Rolling walker (2 wheeled) Transfers: Sit to/from [employer] Sit to Stand: Min assist;Mod  assist Stand pivot transfers: Mod assist       General transfer comment: labored movement with verbal cues for proper hand placement during sit to stands  Ambulation/Gait Ambulation/Gait assistance: Mod assist Gait Distance (Feet): 30 Feet Assistive device: Rolling walker (2 wheeled) Gait Pattern/deviations: Decreased step length - right;Decreased stance time - right;Decreased stride length;Decreased dorsiflexion - right;Scissoring;Narrow base of support Gait velocity: decreased   General Gait Details: demonstrates increased endurance/distance with difficulty touching right heel down when taking steps due to limited dorsiflexion and fair/poor coordination   Stairs             Wheelchair Mobility    Modified Rankin (Stroke Patients Only)       Balance Overall balance assessment: Needs assistance Sitting-balance support: Feet supported Sitting balance-Leahy Scale: Fair Sitting balance - Comments: fair/good seated in chair   Standing balance support: During functional activity;Bilateral upper extremity supported Standing balance-Leahy Scale: Poor Standing balance comment: fair/poor using RW                            Cognition Arousal/Alertness: Awake/alert Behavior During Therapy: WFL for tasks assessed/performed Overall Cognitive Status: Within Functional Limits for tasks assessed                                        Exercises General Exercises - Lower Extremity Long Arc Quad: Seated;AROM;Strengthening;Both;10 reps Hip Flexion/Marching: Seated;AROM;Strengthening;Both;10 reps Toe Raises: Seated;AROM;Strengthening;Both;10 reps Heel Raises: Seated;AROM;Strengthening;Both;10 reps  General Comments        Pertinent Vitals/Pain Pain Assessment: No/denies pain    Home Living                      Prior Function            PT Goals (current goals can now be found in the care plan section) Acute Rehab PT  Goals Patient Stated Goal: to go home PT Goal Formulation: With patient/family Time For Goal Achievement: 01/01/20 Potential to Achieve Goals: Good Progress towards PT goals: Progressing toward goals    Frequency    Min 5X/week      PT Plan Current plan remains appropriate    Co-evaluation PT/OT/SLP Co-Evaluation/Treatment: Yes            AM-PAC PT "6 Clicks" Mobility   Outcome Measure  Help needed turning from your back to your side while in a flat bed without using bedrails?: A Lot Help needed moving from lying on your back to sitting on the side of a flat bed without using bedrails?: A Lot Help needed moving to and from a bed to a chair (including a wheelchair)?: A Lot Help needed standing up from a chair using your arms (e.g., wheelchair or bedside chair)?: A Lot Help needed to walk in hospital room?: A Lot Help needed climbing 3-5 steps with a railing? : Total 6 Click Score: 11    End of Session Equipment Utilized During Treatment: Gait belt Activity Tolerance: Patient tolerated treatment well;Patient limited by fatigue Patient left: in chair;with call bell/phone within reach;with chair alarm set Nurse Communication: Mobility status PT Visit Diagnosis: Unsteadiness on feet (R26.81);Other abnormalities of gait and mobility (R26.89);Muscle weakness (generalized) (M62.81)     Time: 4765-4650 PT Time Calculation (min) (ACUTE ONLY): 30 min  Charges:  $Gait Training: 8-22 mins $Therapeutic Exercise: 8-22 mins                     12:36 PM, 12/20/19 [name], MPT Physical Therapist with [facility] [phone] office [phone] mobile phone

## 2019-12-20 NOTE — Plan of Care (Signed)
  Problem: Education: Goal: Knowledge of General Education information will improve Description: Including pain rating scale, medication(s)/side effects and non-pharmacologic comfort measures Outcome: Progressing   Problem: Health Behavior/Discharge Planning: Goal: Ability to manage health-related needs will improve Outcome: Progressing   Problem: Education: Goal: Knowledge of secondary prevention will improve Outcome: Progressing   

## 2019-12-20 NOTE — Progress Notes (Signed)
Modified Barium Swallow Progress Note  Patient Details  Name: Alec Watson MRN: [id number] Date of Birth: 1955-05-22  Today's Date: 12/20/2019  Modified Barium Swallow completed.  Full report located under Chart Review in the Imaging Section.  Brief recommendations include the following:  Clinical Impression  Pt presents with mild to mild/mod oral phase and mild pharyngeal phase dysphagia characterized by reduced lingual strength resulting in reduced bolus control/cohesion, piecemeal deglutition, premature spillage, and min oral residue; pharyngeal phase is characterized by premature spillage/delay in swallow initiation and reduced laryngeal closure resulting in variable sensed and silent aspiration in trace to min amounts with thins and NTL. Pt did well with teaspoon presentations or small cup sips of thin when cued to hold the bolus orally and then swallow. Aspiration generally occurred when taking larger sips and with straw (Pt needs cueing for small sips). Chin tuck with head turn R was ineffective. Recommend D3/mech soft and NTL via small cup sips only, no straws, po medications whole in puree, alternate solids/liquids, and trials tsp thin/cup thin water with SLP at SNF.    Swallow Evaluation Recommendations       SLP Diet Recommendations: Dysphagia 3 (Mech soft) solids;Nectar thick liquid   Liquid Administration via: Cup;No straw   Medication Administration: Whole meds with puree   Supervision: Patient able to self feed;Intermittent supervision to cue for compensatory strategies   Compensations: Small sips/bites;Lingual sweep for clearance of pocketing;Follow solids with liquid (cue Pt to cough is sounds congested)   Postural Changes: Remain semi-upright after after feeds/meals (Comment);Seated upright at 90 degrees   Oral Care Recommendations: Oral care BID;Staff/trained caregiver to provide oral care   Other Recommendations: Prohibited food (jello, ice cream, thin  soups);Order thickener from pharmacy;Clarify dietary restrictions   Thank you,  Alec Watson, CCC-SLP [phone]  Alec Watson 12/20/2019,11:14 AM

## 2019-12-20 NOTE — Progress Notes (Signed)
Passed by pt's room and did not see him in recliner or in bed. Entered room to find pt standing in bathroom. Condom cath off and drainage bag hanging on rail inside bathroom. Asked pt why he was up and he said, "I had to use the bathroom." No stool noted in toilet or on patient. Asked pt why he did not call before getting up out of chair, pt states, "I forgot." Pt assisted back to bed, needed moderate assistance as he has right sided weakness and difficulty lifting right foot. Asked pt if he was OK, pt stated, "I'm alright. I just took my time. Sorry I spilled my water in the floor." Asked pt if he fell while up, he stated, "Naw, I didn't fall. I just took my time." Condom cath replaced, pt agreeable and cooperative.  Please note, chair alarm was in recliner and turned "on" but did not ring when pt up out of chair. Chair alarm replaced.

## 2019-12-20 NOTE — TOC Progression Note (Signed)
Transition of Care [facility]) - Progression Note    Patient Details  Name: Alec Watson MRN: [id number] Date of Birth: [birth year]  Transition of Care [facility]) CM/SW Contact  Elliot Gault, LCSW Phone Number: 12/20/2019, 3:19 PM  Clinical Narrative:     TOC following. Spoke with pt to update on SNF bed offers. Pt selects [facility]. Spoke with Jill Side at [facility] to update. They will start auth today. DC timeframe not yet known.   TOC will follow.  Expected Discharge Plan: Skilled Nursing Facility Barriers to Discharge: Continued Medical Work up  Expected Discharge Plan and Services Expected Discharge Plan: Skilled Nursing Facility In-house Referral: Clinical Social Work     Living arrangements for the past 2 months: Single Family Home                                       Social Determinants of Health (SDOH) Interventions    Readmission Risk Interventions No flowsheet data found.

## 2019-12-21 ENCOUNTER — Telehealth: Payer: Self-pay | Admitting: *Deleted

## 2019-12-21 ENCOUNTER — Inpatient Hospital Stay (INDEPENDENT_AMBULATORY_CARE_PROVIDER_SITE_OTHER): Payer: Medicaid Other

## 2019-12-21 DIAGNOSIS — I639 Cerebral infarction, unspecified: Secondary | ICD-10-CM

## 2019-12-21 MED ORDER — RESOURCE THICKENUP CLEAR PO POWD
ORAL | Status: DC | PRN
  Filled 2019-12-21: qty 125

## 2019-12-21 NOTE — Telephone Encounter (Signed)
Verbal order from Dr. Laural Benes for event monitor for stroke for 30 days.

## 2019-12-21 NOTE — TOC Progression Note (Signed)
Transition of Care [facility]) - Progression Note    Patient Details  Name: Alec Watson MRN: [id number] Date of Birth: 12-Jun-1955  Transition of Care [facility]) CM/SW Contact  Elliot Gault, LCSW Phone Number: 12/21/2019, 4:37 PM  Clinical Narrative:     TOC following. Pt medically stable for dc per MD. Sherron Monday with Jill Side at [facility] this AM to update. Per Jill Side, they cannot admit until insurance auth received. She is hopeful they will have it by tomorrow.   TOC will follow up in AM to further assist with dc planning.  Expected Discharge Plan: Skilled Nursing Facility Barriers to Discharge: Insurance Authorization  Expected Discharge Plan and Services Expected Discharge Plan: Skilled Nursing Facility In-house Referral: Clinical Social Work     Living arrangements for the past 2 months: Single Family Home                                       Social Determinants of Health (SDOH) Interventions    Readmission Risk Interventions No flowsheet data found.

## 2019-12-21 NOTE — Progress Notes (Signed)
Physical Therapy Treatment Patient Details Name: Alec Watson MRN: [id number] DOB: 27-Mar-1956 Today's Date: 12/21/2019    History of Present Illness Alec Watson is a 64 y.o. male with medical history significant for hypertension who presents to the emergency department via EMS. Patient was unable to provide history due to speech difficulty. History was obtained from ED physician and from ED medical record. Per report, family noted patient to be confused and unsteady on his feet, so EMS was activated, last known well time was possibly in the morning, he had falling in the yard and was presumed to be laying there for about an hour prior to being noted by family.  He was said to complain of generalized weakness.    PT Comments    Patient demonstrates improvement for bed mobility and able to sit up at bedside with bed flat without use of bed rails, demonstrates increased endurance/distance for ambulation with fair/good carryover for touching down right heel when taking steps, limited mostly due to fatigue and required sitting rest break in wheelchair before making it back to room.  Patient tolerated staying up in chair after therapy.  Patient will benefit from continued physical therapy in hospital and recommended venue below to increase strength, balance, endurance for safe ADLs and gait.    Follow Up Recommendations  SNF     Equipment Recommendations  Rolling walker with 5" wheels    Recommendations for Other Services       Precautions / Restrictions Precautions Precautions: Fall Restrictions Weight Bearing Restrictions: No    Mobility  Bed Mobility Overal bed mobility: Needs Assistance Bed Mobility: Supine to Sit;Sit to Supine     Supine to sit: Supervision Sit to supine: Supervision   General bed mobility comments: demonstrates improvement for bed mobility without use of bed rail  Transfers Overall transfer level: Needs assistance Equipment used: Rolling walker  (2 wheeled) Transfers: Sit to/from [employer] Sit to Stand: Min assist Stand pivot transfers: Min assist;Mod assist       General transfer comment: requires verbal cues for proper hand placement during sit to stands with fair/good carryover  Ambulation/Gait Ambulation/Gait assistance: Min assist;Mod assist Gait Distance (Feet): 50 Feet Assistive device: Rolling walker (2 wheeled) Gait Pattern/deviations: Decreased step length - right;Decreased stance time - right;Decreased stride length;Decreased dorsiflexion - right;Narrow base of support Gait velocity: decreased   General Gait Details: demonstrates improvement for right heel to toe stepping touching heel down consistently without verbal cueing, increased endurance/distance and limited secondary to fatigue, followed with w/c for safety   Stairs             Wheelchair Mobility    Modified Rankin (Stroke Patients Only)       Balance Overall balance assessment: Needs assistance Sitting-balance support: Feet supported;No upper extremity supported Sitting balance-Leahy Scale: Good Sitting balance - Comments: seated at EOB   Standing balance support: During functional activity;Bilateral upper extremity supported Standing balance-Leahy Scale: Poor Standing balance comment: fair/poor using RW                            Cognition Arousal/Alertness: Awake/alert Behavior During Therapy: WFL for tasks assessed/performed Overall Cognitive Status: Within Functional Limits for tasks assessed                                        Exercises General  Exercises - Lower Extremity Long Arc Quad: Seated;AROM;Strengthening;Both;10 reps Hip Flexion/Marching: Seated;AROM;Strengthening;Both;10 reps Toe Raises: Seated;AROM;Strengthening;Both;10 reps Heel Raises: Seated;AROM;Strengthening;Both;10 reps    General Comments        Pertinent Vitals/Pain Pain Assessment: No/denies pain     Home Living                      Prior Function            PT Goals (current goals can now be found in the care plan section) Acute Rehab PT Goals Patient Stated Goal: to go home PT Goal Formulation: With patient/family Time For Goal Achievement: 01/01/20 Potential to Achieve Goals: Good Progress towards PT goals: Progressing toward goals    Frequency    Min 6X/week      PT Plan Current plan remains appropriate    Co-evaluation              AM-PAC PT "6 Clicks" Mobility   Outcome Measure  Help needed turning from your back to your side while in a flat bed without using bedrails?: None Help needed moving from lying on your back to sitting on the side of a flat bed without using bedrails?: A Little Help needed moving to and from a bed to a chair (including a wheelchair)?: A Lot Help needed standing up from a chair using your arms (e.g., wheelchair or bedside chair)?: A Little Help needed to walk in hospital room?: A Lot Help needed climbing 3-5 steps with a railing? : A Lot 6 Click Score: 16    End of Session Equipment Utilized During Treatment: Gait belt Activity Tolerance: Patient tolerated treatment well;Patient limited by fatigue Patient left: in chair;with call bell/phone within reach;with chair alarm set Nurse Communication: Mobility status PT Visit Diagnosis: Unsteadiness on feet (R26.81);Other abnormalities of gait and mobility (R26.89);Muscle weakness (generalized) (M62.81)     Time: 1610-9604 PT Time Calculation (min) (ACUTE ONLY): 25 min  Charges:  $Gait Training: 8-22 mins $Therapeutic Exercise: 8-22 mins                     11:21 AM, 12/21/19 Alec Watson, MPT Physical Therapist with [facility] [phone] office [phone] mobile phone

## 2019-12-21 NOTE — Plan of Care (Signed)
  Problem: Education: Goal: Knowledge of General Education information will improve Description Including pain rating scale, medication(s)/side effects and non-pharmacologic comfort measures Outcome: Progressing   Problem: Health Behavior/Discharge Planning: Goal: Ability to manage health-related needs will improve Outcome: Progressing   

## 2019-12-21 NOTE — Plan of Care (Signed)
  Problem: Education: Goal: Knowledge of General Education information will improve Description: Including pain rating scale, medication(s)/side effects and non-pharmacologic comfort measures Outcome: Progressing   Problem: Health Behavior/Discharge Planning: Goal: Ability to manage health-related needs will improve Outcome: Progressing   Problem: Education: Goal: Knowledge of secondary prevention will improve Outcome: Progressing

## 2019-12-21 NOTE — Care Management (Signed)
12/21/2019 10:03 AM  I called [facility] office and spoke with Lukisha Pinnix to arrange for 30 day event monitor.  They will place prior to discharge to SNF.    Maryln Manuel MD

## 2019-12-21 NOTE — Progress Notes (Signed)
Occupational Therapy Treatment Patient Details Name: Alec Watson MRN: [id number] DOB: 1955/12/20 Today's Date: 12/21/2019    History of present illness Alec Watson is a 64 y.o. male with medical history significant for hypertension who presents to the emergency department via EMS. Patient was unable to provide history due to speech difficulty. History was obtained from ED physician and from ED medical record. Per report, family noted patient to be confused and unsteady on his feet, so EMS was activated, last known well time was possibly in the morning, he had falling in the yard and was presumed to be laying there for about an hour prior to being noted by family.  He was said to complain of generalized weakness.   OT comments  Pt awake, agreeable to OT evaluation this am. Pt performing LB dressing incorporating RUE with min difficulty manipulating socks, cuing for LB positioning. Pt continues to requiring mod assist for functional mobility/transfers, using RUE for RW use without difficulty or need for cuing today.    Follow Up Recommendations  SNF    Equipment Recommendations  None recommended by OT       Precautions / Restrictions Precautions Precautions: Fall       Mobility Bed Mobility Overal bed mobility: Needs Assistance Bed Mobility: Supine to Sit     Supine to sit: Supervision        Transfers Overall transfer level: Needs assistance Equipment used: Rolling walker (2 wheeled) Transfers: Sit to/from [employer] Sit to Stand: Min assist;Mod assist Stand pivot transfers: Mod assist                ADL either performed or assessed with clinical judgement   ADL Overall ADL's : Needs assistance/impaired Eating/Feeding: Set up;Sitting Eating/Feeding Details (indicate cue type and reason): Assist for pushing straw into thicken liquid cup. Pt was able to open salt and pour over food. Was able to self-feed Grooming: Wash/dry hands;Wash/dry  face;Set up;Sitting Grooming Details (indicate cue type and reason): Provided washcloth while seated             Lower Body Dressing: Supervision/safety;Sitting/lateral leans Lower Body Dressing Details (indicate cue type and reason): Cuing to bring RLE up onto left knee for donning socks. Using BUE for task Toilet Transfer: Moderate assistance;Stand-pivot;RW Toilet Transfer Details (indicate cue type and reason): simulated with bed to chair transfer           General ADL Comments: Pt with improved incorporation of RUE today               Cognition Arousal/Alertness: Awake/alert Behavior During Therapy: WFL for tasks assessed/performed Overall Cognitive Status: Within Functional Limits for tasks assessed                                                     Pertinent Vitals/ Pain       Pain Assessment: No/denies pain         Frequency  Min 2X/week        Progress Toward Goals  OT Goals(current goals can now be found in the care plan section)  Progress towards OT goals: Progressing toward goals  Acute Rehab OT Goals Patient Stated Goal: to go home OT Goal Formulation: With patient Time For Goal Achievement: 01/02/20 Potential to Achieve Goals: Good ADL Goals Pt Will Perform Eating: with set-up;sitting  Pt Will Perform Grooming: with set-up;sitting;standing Pt Will Perform Upper Body Dressing: with supervision;sitting Pt Will Transfer to Toilet: with min assist;stand pivot transfer;ambulating;regular height toilet;bedside commode Pt Will Perform Toileting - Clothing Manipulation and hygiene: with min assist;sitting/lateral leans;sit to/from stand Pt/caregiver will Perform Home Exercise Program: Increased strength;Right Upper extremity;With Supervision;With written HEP provided  Plan Discharge plan remains appropriate          End of Session Equipment Utilized During Treatment: Gait belt;Rolling walker  OT Visit Diagnosis: Muscle  weakness (generalized) (M62.81);Hemiplegia and hemiparesis Hemiplegia - Right/Left: Right Hemiplegia - dominant/non-dominant: Dominant Hemiplegia - caused by: Cerebral infarction   Activity Tolerance Patient tolerated treatment well   Patient Left in chair;with call bell/phone within reach;with chair alarm set           Time: 2800-3491 OT Time Calculation (min): 26 min  Charges: OT General Charges $OT Visit: 1 Visit OT Treatments $Self Care/Home Management : 23-37 mins    Ezra Sites, OTR/L  [phone] 12/21/2019, 9:16 AM

## 2019-12-21 NOTE — Progress Notes (Signed)
  Speech Language Pathology Treatment: Dysphagia  Patient Details Name: Alec Watson MRN: [id number] DOB: 1956-02-09 Today's Date: 12/21/2019 Time: 4431-5400 SLP Time Calculation (min) (ACUTE ONLY): 25 min  Assessment / Plan / Recommendation Clinical Impression  Pt seen for ongoing dysphagia intervention following MBSS completed yesterday with recommendation for D3/mech soft and NTL (nectar-thick liquids) and medications whole in puree. Pt found with ice water on his tray upon SLP arrival. SLP reinforced need and rationale for nectar-thick liquids with Pt and RN; aspiration precautions and diet recommendations placed on the wall at the head of bed. SLP also provided written reminders for Pt to take small sips (No straws) and swallow fast and hard. Pt self presented vanilla Magic Cup with his right hand. He continues to present with occasional congested cough during po intake. He was encouraged to cough "hard" when he noted the sensation to cough. In the MBSS, he generally coughed after aspiration, but was not always able to remove aspirate. Pt will need intermittent supervision with meals to reinforce strategies.    HPI HPI: Alec Watson is a 64 y.o. male with medical history significant for hypertension who presents to the emergency department via EMS. Patient was unable to provide history due to speech difficulty. History was obtained from ED physician and from ED medical record. Per report, family noted patient to be confused and unsteady on his feet, so EMS was activated, last known well time was possibly in the morning, he had falling in the yard and was presumed to be laying there for about an hour prior to being noted by family.  He was said to complain of generalized weakness. MRI shows: There is a 1 cm acute infarct in the left paramedian pons, and there is an acute 4 mm infarct in the cerebellar vermis. Additional subcentimeter acute to early subacute infarcts are questioned in the  superior aspect of the left cerebellar hemisphere, right occipital lobe, and left temporo-occipital junction. 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 BSE and SLE ordered. Pt failed the Yale swallow screen in the ER.       SLP Plan  Continue with current plan of care       Recommendations  Diet recommendations: Dysphagia 3 (mechanical soft);Nectar-thick liquid Liquids provided via: Cup;No straw Medication Administration: Whole meds with puree (Pt does not like applesauce, does like Magic Cup) Supervision: Patient able to self feed;Intermittent supervision to cue for compensatory strategies Compensations: Small sips/bites;Lingual sweep for clearance of pocketing;Follow solids with liquid Postural Changes and/or Swallow Maneuvers: Out of bed for meals;Seated upright 90 degrees;Upright 30-60 min after meal                Oral Care Recommendations: Oral care BID;Oral care prior to ice chip/H20;Staff/trained caregiver to provide oral care Follow up Recommendations: Skilled Nursing facility SLP Visit Diagnosis: Dysphagia, oropharyngeal phase (R13.12) Plan: Continue with current plan of care       GO                Tyson Masin 12/21/2019, 11:35 AM

## 2019-12-21 NOTE — Progress Notes (Signed)
PROGRESS NOTE    Alec Watson  GEZ:[id number] DOB: [birth year] DOA: 12/17/2019 PCP: Patient, No Pcp Per   Brief Narrative:  Per HPI: Alec Cooks Gallowayis a 64 y.o.malewith medical history significant forhypertension who presents to the emergency department via EMS. Patient was unable to provide history due to speech difficulty. History was obtained from ED physician and from ED medical record. Per report, family noted patient to be confused and unsteady on his feet, so EMS was activated,last known well time was possibly in the morning, he had falling in the yard and was presumed to be laying there for about an hour prior to being noted by family.Hewas said to complain of generalized weakness.  9/13: Patient admitted with speech difficulty, generalized weakness and unsteadiness with concern for CVA.  Brain MRI with acute CVA to pons region noted with 2D echocardiogram as well as neurology on consultation ordered and pending.  PT evaluation pending, but patient may very likely require SNF as he appears to have significant deficits.  9/14: Patient recommended for SNF.  2D echocardiogram without significant abnormalities.  He continues have significant difficulty with swallowing and SLP will pursue MBS.  Neurology evaluation pending.  9/16:  Pt stable for SNF. Awaiting insurance authorization.    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    Essential hypertension -poorly controlled, added amlodipine and losartan, metoprolol as needed -follow and titrate as needed.   Hypoalbuminemia -Supplementation once patient can tolerate p.o. -Albumin 3.3  Microcytic anemia-stable -Iron studies within normal limits -Continue to follow CBC  DVT prophylaxis: SCDs Code Status: Full Family Communication: Spoke with sister on phone 9/14, 9/16 Disposition Plan:  Status is: Inpatient  Remains inpatient appropriate because:Ongoing diagnostic testing needed not appropriate for outpatient work up and Inpatient level of care appropriate due to severity of illness  Dispo: The patient is from: Home              Anticipated d/c is to: SNF              Anticipated d/c date is: 1 day              Patient currently is medically stable to d/c.  Awaiting for insurance authorization for SNF placement.    Consultants:   Neurology  Procedures:   See below  Antimicrobials:   None  Subjective: Patient sitting up in chair eating breakfast. No specific complaints.    Objective: Vitals:   12/20/19 2031 12/20/19 2319 12/21/19 0612 12/21/19 1352  BP: (!) 174/90 (!) 160/80 (!) 152/89 (!) 155/93  Pulse: 66 (!) 59 (!) 58 72  Resp: 20  20 17   Temp: 98.8 F (37.1 C)  98.2 F (36.8 C) 97.9 F (36.6 C)  TempSrc:    Oral  SpO2: 97%  100% 100%  Weight:      Height:        Intake/Output Summary (Last 24  hours) at 12/21/2019 1400 Last data filed at 12/21/2019 0900 Gross per 24 hour  Intake 240 ml  Output 800 ml  Net -560 ml   Filed Weights   12/17/19 1722 12/18/19 1417  Weight: 67.5 kg 62.9 kg   Examination:  General exam: Appears calm and comfortable  Respiratory system: Clear to auscultation. Respiratory effort normal. Cardiovascular system: normal S1 & S2 heard.  Gastrointestinal system: Abdomen is nondistended, soft and nontender.   Central nervous system: Alert and awake, nonfocal exam.  Extremities: No edema, no cyanosis, no clubbing.  Skin: No rashes, lesions or ulcers Psychiatry: normal affect today.   Data Reviewed: I have personally reviewed following labs and imaging studies  CBC: Recent Labs  Lab 12/17/19 1813 12/18/19 0538 12/19/19 0624 12/20/19 0627  WBC 5.5 6.1 10.2 6.8  NEUTROABS 3.2  --   --   --   HGB 11.7* 12.3* 13.0 12.4*  HCT 35.9* 38.1* 40.1 37.7*  MCV 78.4* 78.1* 77.6* 77.3*  PLT 227 232 246 232   Basic Metabolic Panel: Recent Labs  Lab 12/17/19 1813 12/18/19 0538 12/19/19 0624 12/20/19 0627  NA 138 139 140 138  K 3.8 3.9 3.5 3.6  CL 108 107 108 108  CO2 GLUCOSE 108* 95 110* 103*  BUN CREATININE 0.83 0.81 0.87 0.85  CALCIUM 8.8* 9.1 9.3 9.1  MG  --  2.2 2.1 2.2  PHOS  --  3.5  --   --    GFR: Estimated Creatinine Clearance: 78.1 mL/min (by C-G formula based on SCr of 0.85 mg/dL). Liver Function Tests: Recent Labs  Lab 12/17/19 1813 12/18/19 0538  AST 14* 14*  ALT 9 11  ALKPHOS 61 52  BILITOT 0.4 0.7  PROT 6.4* 6.3*  ALBUMIN 3.3* 3.3*   No results for input(s): LIPASE, AMYLASE in the last 168 hours. No results for input(s): AMMONIA in the last 168 hours. Coagulation Profile: Recent Labs  Lab 12/17/19 1813 12/18/19 0538  INR 1.0 1.1   Cardiac Enzymes: No results for input(s): CKTOTAL, CKMB, CKMBINDEX, TROPONINI in the last 168 hours. BNP (last 3 results) No results for input(s): PROBNP in the last 8760 hours. HbA1C: No results for input(s): HGBA1C in the last 72 hours. CBG: Recent Labs  Lab 12/17/19 1709  GLUCAP 163*   Lipid Profile: No results for input(s): CHOL, HDL, LDLCALC, TRIG, CHOLHDL, LDLDIRECT in the last 72 hours. Thyroid Function Tests: No results for input(s): TSH, T4TOTAL, FREET4, T3FREE, THYROIDAB in the last 72 hours. Anemia Panel: No results for input(s): VITAMINB12, FOLATE, FERRITIN, TIBC, IRON, RETICCTPCT  in the last 72 hours. Sepsis Labs: No results for input(s): PROCALCITON, LATICACIDVEN in the last 168 hours.  Recent Results (from the past 240 hour(s))  SARS Coronavirus 2 by RT PCR (hospital order, performed in [facility] hospital lab) Nasopharyngeal Nasopharyngeal Swab     Status: None   Collection Time: 12/17/19  5:55 PM   Specimen: Nasopharyngeal Swab  Result Value Ref Range Status   SARS Coronavirus 2 NEGATIVE NEGATIVE Final    Comment: (NOTE) SARS-CoV-2 target nucleic acids are NOT DETECTED.  The SARS-CoV-2 RNA is generally detectable in upper and lower respiratory specimens during the acute phase of infection. The lowest concentration of SARS-CoV-2 viral copies this assay can detect is 250 copies / mL. A negative result does not preclude SARS-CoV-2 infection and should not be used as the sole basis for treatment or other patient management decisions.  A  negative result may occur with improper specimen collection / handling, submission of specimen other than nasopharyngeal swab, presence of viral mutation(s) within the areas targeted by this assay, and inadequate number of viral copies (<250 copies / mL). A negative result must be combined with clinical observations, patient history, and epidemiological information.  Fact Sheet for Patients:   BoilerBrush.com.cy  Fact Sheet for Healthcare Providers: https://pope.com/  This test is not yet approved or  cleared by the [place] FDA and has been authorized for detection and/or diagnosis of SARS-CoV-2 by FDA under an Emergency Use Authorization (EUA).  This EUA will remain in effect (meaning this test can be used) for the duration of the COVID-19 declaration under Section 564(b)(1) of the Act, 21 U.S.C. section 360bbb-3(b)(1), unless the authorization is terminated or revoked sooner.  Performed at [facility], [address], [place], [place] [address]    Radiology  Studies: DG Swallowing [facility] Pathology  Result Date: 12/20/2019 Objective Swallowing Evaluation: Type of Study: MBS-Modified Barium Swallow Study  Patient Details Name: Alec Watson MRN: [id number] Date of Birth: 1956-01-21 Today's Date: 12/20/2019 Time: SLP Start Time (ACUTE ONLY): 1000 -SLP Stop Time (ACUTE ONLY): 1025 SLP Time Calculation (min) (ACUTE ONLY): 25 min Past Medical History: Past Medical History: Diagnosis Date . Hypertension  Past Surgical History: Past Surgical History: Procedure Laterality Date . NECK SURGERY   HPI: Alec Watson is a 64 y.o. male with medical history significant for hypertension who presents to the emergency department via EMS. Patient was unable to provide history due to speech difficulty. History was obtained from ED physician and from ED medical record. Per report, family noted patient to be confused and unsteady on his feet, so EMS was activated, last known well time was possibly in the morning, he had falling in the yard and was presumed to be laying there for about an hour prior to being noted by family.  He was said to complain of generalized weakness. MRI shows: There is a 1 cm acute infarct in the left paramedian pons, and there is an acute 4 mm infarct in the cerebellar vermis. Additional subcentimeter acute to early subacute infarcts are questioned in the superior aspect of the left cerebellar hemisphere, right occipital lobe, and left temporo-occipital junction. 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 BSE and SLE ordered. Pt failed the Yale swallow screen in the ER.  Subjective: "Okay" Assessment / Plan / Recommendation CHL IP CLINICAL IMPRESSIONS 12/20/2019 Clinical Impression Pt presents with mild to mild/mod oral phase and mild pharyngeal phase dysphagia characterized by reduced lingual strength resulting in reduced bolus control/cohesion, piecemeal  deglutition, premature spillage, and min oral residue; pharyngeal phase is characterized by premature spillage/delay in swallow initiation and reduced laryngeal closure resulting in variable sensed and silent aspiration in trace to min amounts with thins and NTL. Pt did well with teaspoon presentations or small cup sips of thin when cued to hold the bolus orally and then swallow. Aspiration generally occurred when taking larger sips and with straw (Pt needs cueing for small sips). Chin tuck with head turn R was ineffective. Recommend D3/mech soft and NTL via small cup sips only, no straws, po medications whole in puree, alternate solids/liquids, and trials tsp thin/cup thin water with SLP at SNF.  SLP Visit Diagnosis Dysphagia, oropharyngeal phase (R13.12) Attention and concentration deficit following -- Frontal lobe and executive function deficit following -- Impact on safety and function Mild  aspiration risk;Moderate aspiration risk   CHL IP TREATMENT RECOMMENDATION 12/20/2019 Treatment Recommendations Therapy as outlined in treatment plan below   Prognosis 12/20/2019 Prognosis for Safe Diet Advancement Good Barriers to Reach Goals (No Data) Barriers/Prognosis Comment -- CHL IP DIET RECOMMENDATION 12/20/2019 SLP Diet Recommendations Dysphagia 3 (Mech soft) solids;Nectar thick liquid Liquid Administration via Cup;No straw Medication Administration Whole meds with puree Compensations Small sips/bites;Lingual sweep for clearance of pocketing;Follow solids with liquid Postural Changes Remain semi-upright after after feeds/meals (Comment);Seated upright at 90 degrees   CHL IP OTHER RECOMMENDATIONS 12/20/2019 Recommended Consults -- Oral Care Recommendations Oral care BID;Staff/trained caregiver to provide oral care Other Recommendations Prohibited food (jello, ice cream, thin soups);Order thickener from pharmacy;Clarify dietary restrictions   CHL IP FOLLOW UP RECOMMENDATIONS 12/20/2019 Follow up Recommendations Sk[facility] D[facility] 12/20/2019 Speech Therapy Frequency (ACUTE ONLY) min 2x/week Treatment Duration 2 weeks      CHL IP ORAL PHASE 12/20/2019 Oral Phase Impaired Oral - Pudding Teaspoon -- Oral - Pudding Cup -- Oral - Honey Teaspoon -- Oral - Honey Cup -- Oral - Nectar Teaspoon -- Oral - Nectar Cup Incomplete tongue to palate contact;Lingual/palatal residue Oral - Nectar Straw WFL Oral - Thin Teaspoon WFL;Premature spillage Oral - Thin Cup Premature spillage;Lingual/palatal residue;Decreased bolus cohesion Oral - Thin Straw -- Oral - Puree Weak lingual manipulation;Lingual/palatal residue;Piecemeal swallowing;Decreased bolus cohesion Oral - Mech Soft -- Oral - Regular Delayed oral transit;Piecemeal swallowing;Lingual/palatal residue;Decreased bolus cohesion Oral - Multi-Consistency -- Oral - Pill Premature spillage Oral Phase - Comment --  CHL IP PHARYNGEAL PHASE 12/20/2019 Pharyngeal Phase Impaired Pharyngeal- Pudding Teaspoon -- Pharyngeal -- Pharyngeal- Pudding Cup -- Pharyngeal -- Pharyngeal- Honey Teaspoon -- Pharyngeal -- Pharyngeal- Honey Cup -- Pharyngeal -- Pharyngeal- Nectar Teaspoon -- Pharyngeal -- Pharyngeal- Nectar Cup Delayed swallow initiation-pyriform sinuses Pharyngeal -- Pharyngeal- Nectar Straw Delayed swallow initiation-pyriform sinuses;Reduced airway/laryngeal closure;Penetration/Aspiration during swallow;Penetration/Apiration after swallow;Trace aspiration Pharyngeal Material enters airway, passes BELOW cords and not ejected out despite cough attempt by patient Pharyngeal- Thin Teaspoon Delayed swallow initiation-pyriform sinuses;Reduced airway/laryngeal closure;Penetration/Aspiration during swallow;Trace aspiration Pharyngeal Material does not enter airway;Material enters airway, passes BELOW cords and not ejected out despite cough attempt by patient Pharyngeal- Thin Cup Delayed swallow initiation-pyriform sinuses;Reduced airway/laryngeal  closure;Penetration/Aspiration during swallow;Penetration/Apiration after swallow;Trace aspiration;Moderate aspiration Pharyngeal Material enters airway, CONTACTS cords and not ejected out;Material enters airway, passes BELOW cords and not ejected out despite cough attempt by patient Pharyngeal- Thin Straw -- Pharyngeal -- Pharyngeal- Puree Delayed swallow initiation-vallecula;Delayed swallow initiation-pyriform sinuses Pharyngeal -- Pharyngeal- Mechanical Soft -- Pharyngeal -- Pharyngeal- Regular Delayed swallow initiation-pyriform sinuses Pharyngeal -- Pharyngeal- Multi-consistency -- Pharyngeal -- Pharyngeal- Pill (No Data) Pharyngeal -- Pharyngeal Comment --  CHL IP CERVICAL ESOPHAGEAL PHASE 12/20/2019 Cervical Esophageal Phase WFL Pudding Teaspoon -- Pudding Cup -- Honey Teaspoon -- Honey Cup -- Nectar Teaspoon -- Nectar Cup -- Nectar Straw -- Thin Teaspoon -- Thin Cup -- Thin Straw -- Puree -- Mechanical Soft -- Regular -- Multi-consistency -- Pill -- Cervical Esophageal Comment -- Thank you, Havery MorosDabney Porter, CCC-SLP [phone] PORTER,DABNEY 12/20/2019, 11:31 AM              Scheduled Meds: .  stroke: mapping our early stages of recovery book   Does not apply Once  . amLODipine  5 mg Oral Daily  . aspirin  81 mg Oral Daily  . atorvastatin  20 mg Oral Daily  . losartan  50 mg Oral Daily   Continuous Infusions:   LOS: 3  days   Time spent: 30 minutes  Standley Dakins, MD How to contact the [facility] Attending or Consulting provider 7A - 7P or covering provider during after hours 7P -7A, for this patient?  1. Check the care team in [facility] and look for a) attending/consulting [facility] provider listed and b) the [facility] team listed 2. Log into [email or web address] and use [place]'s universal password to access. If you do not have the password, please contact the hospital operator. 3. Locate the [facility] provider you are looking for under [facility] and page to a number that you can be directly reached. 4. If you  still have difficulty reaching the provider, please page the [facility] (Director on Call) for the Hospitalists listed on amion for assistance.   If 7PM-7AM, please contact night-coverage [email or web address] 12/21/2019, 2:00 PM

## 2019-12-22 LAB — SARS CORONAVIRUS 2 BY RT PCR (HOSPITAL ORDER, PERFORMED IN ~~LOC~~ HOSPITAL LAB): SARS Coronavirus 2: NEGATIVE

## 2019-12-22 MED ORDER — ASPIRIN 81 MG PO CHEW: 81.0000 mg | CHEWABLE_TABLET | Freq: Every day | ORAL | Status: AC

## 2019-12-22 MED ORDER — HYDRALAZINE HCL 25 MG PO TABS: 25.0000 mg | ORAL_TABLET | Freq: Three times a day (TID) | ORAL | Status: AC

## 2019-12-22 MED ORDER — ATORVASTATIN CALCIUM 20 MG PO TABS: 20.0000 mg | ORAL_TABLET | Freq: Every evening | ORAL | Status: AC

## 2019-12-22 MED ORDER — HYDRALAZINE HCL 25 MG PO TABS
25.0000 mg | ORAL_TABLET | Freq: Three times a day (TID) | ORAL | Status: DC
  Administered 2019-12-22 – 2019-12-25 (×8): 25 mg via ORAL
  Filled 2019-12-22 (×10): qty 1

## 2019-12-22 MED ORDER — LOSARTAN POTASSIUM 50 MG PO TABS: 50.0000 mg | ORAL_TABLET | Freq: Every day | ORAL | Status: AC

## 2019-12-22 MED ORDER — AMLODIPINE BESYLATE 10 MG PO TABS: 10.0000 mg | ORAL_TABLET | Freq: Every day | ORAL | Status: AC

## 2019-12-22 NOTE — Discharge Summary (Addendum)
Physician Discharge Summary  Alec Watson ZOX:[id number] DOB: 01-Nov-1955 DOA: 12/17/2019  Admit date: 12/17/2019 Discharge date: 12/25/2019  Admitted From:  HOME  Disposition:  SNF Rehab   Recommendations for Outpatient Follow-up:  1. Follow up with PCP in 2 weeks 2. Follow up with neurology in 1 month 3. Please adjust blood pressure meds as needed for better BP control.   Discharge Condition: STABLE   CODE STATUS: FULL   DIET:  Diet recommendations: Dysphagia 3 (mechanical soft);Nectar-thick liquid Liquids provided via: Cup;No straw Medication Administration: Whole meds with puree (Pt does not like applesauce, does like Magic Cup) Supervision: Patient able to self feed;Intermittent supervision to cue for compensatory strategies Compensations: Small sips/bites;Lingual sweep for clearance of pocketing;Follow solids with liquid Postural Changes and/or Swallow Maneuvers: Out of bed for meals;Seated upright 90 degrees;Upright 30-60 min after meal  Brief Hospitalization Summary: Please see all hospital notes, images, labs for full details of the hospitalization. ADMISSION HPI: Alec Watson is a 64 y.o. male with medical history significant for hypertension who presents to the emergency department via EMS. Patient was unable to provide history due to speech difficulty. History was obtained from ED physician and from ED medical record. Per report, family noted patient to be confused and unsteady on his feet, so EMS was activated, last known well time was possibly in the morning, he had falling in the yard and was presumed to be laying there for about an hour prior to being noted by family.  He was said to complain of generalized weakness.  ED Course:  In the emergency department, he was hemodynamically stable, BP was 161/86. Work-up in the ED showed microcytic anemia and hypoalbuminemia, urinalysis was negative for UTI, alcohol level was less than 10. SARS coronavirus 2 was negative. CT  angiography head and neck was negative for emergent large vessel occlusion or acute core infarct. A 15cc area of delayed perfusion at the right hospital lobe, right PCA distribution, of uncertain significance which could reflect an area of evolving ischemia was noted. CT of head showed no acute abnormality. Teleneurology was consulted and recommended MRI of head and echocardiogram in the morning. Aspirin 300 mg rectal was given. Hospitalist was asked to admit patient for further evaluation and management.  Hospital Course:  9/13:Patient admitted with speech difficulty, generalized weakness and unsteadiness with concern for CVA. Brain MRI with acute CVA to pons region notedwith2D echocardiogram as well as neurology on consultation ordered and pending.PT evaluation pending, but patient may very likely require SNF as he appears to have significant deficits.  9/14: Patient recommended for SNF.  2D echocardiogram without significant abnormalities.  He continues have significant difficulty with swallowing and SLP will pursue MBS.  Neurology evaluation pending.  9/16:  Pt stable for SNF. Awaiting insurance authorization.    9/17-9/20:  Pt remains stable for SNF. 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  -Lipid panel with LDL 114 and hemoglobin A1c 5.7% -Patient will need statinonce cleared for oral medications -PT recommendations for SNF. Pt agreeable.  -SLP recommending dys 3 diet - see notes -Appreciate neurology evaluation - 30 day event monitor has been applied by [facility].     Essential hypertension -poorly controlled, added amlodipine and losartan, and hydralazine -follow and titrate as needed.   Hypoalbuminemia -Supplementation once  patient can tolerate p.o. -Albumin 3.3  Microcytic anemia-stable -Iron studies within normal limits  DVT prophylaxis:SCDs Code Status:Full Family Communication:Spoke with sister on phone 9/14, 9/16 Disposition Plan:SNF  Status is: Inpatient  Discharge Diagnoses:  Principal Problem:   Acute CVA (cerebrovascular accident) [facility]) Active Problems:   Essential hypertension   Hypoalbuminemia   Microcytic anemia   CVA (cerebral vascular accident) [facility])  Discharge Instructions:  Allergies as of 12/25/2019   No Known Allergies     Medication List    TAKE these medications   amLODipine 10 MG tablet Commonly known as: NORVASC Take 1 tablet (10 mg total) by mouth daily.   aspirin 81 MG chewable tablet Chew 1 tablet (81 mg total) by mouth daily.   atorvastatin 20 MG tablet Commonly known as: LIPITOR Take 1 tablet (20 mg total) by mouth every evening.   hydrALAZINE 25 MG tablet Commonly known as: APRESOLINE Take 1 tablet (25 mg total) by mouth every 8 (eight) hours.   losartan 50 MG tablet Commonly known as: COZAAR Take 1 tablet (50 mg total) by mouth daily.       Contact information for follow-up providers    Beryle Beams, MD. Schedule an appointment as soon as possible for a visit in 1 month(s).   Specialty: Neurology Why: stroke follow up appointment Contact information: [address] [place] [address] [phone]            Contact information for after-discharge care    Destination    [facility] Preferred SNF .   Service: Skilled Nursing Contact information: [address] [place] [address] [phone]                 No Known Allergies Allergies as of 12/25/2019   No Known Allergies     Medication List    TAKE these medications   amLODipine 10 MG tablet Commonly known as: NORVASC Take 1 tablet (10 mg total) by mouth daily.   aspirin 81 MG chewable tablet Chew 1 tablet (81 mg total) by  mouth daily.   atorvastatin 20 MG tablet Commonly known as: LIPITOR Take 1 tablet (20 mg total) by mouth every evening.   hydrALAZINE 25 MG tablet Commonly known as: APRESOLINE Take 1 tablet (25 mg total) by mouth every 8 (eight) hours.   losartan 50 MG tablet Commonly known as: COZAAR Take 1 tablet (50 mg total) by mouth daily.      Procedures/Studies: EEG  Result Date: 12/18/2019 Beryle Beams, MD     12/18/2019  6:00 PM [facility] Kofi [name], MD     [email or web address]       HISTORY: This is a 64 year old who presents with altered mental status. This study is being done to evaluate for nonconvulsive seizures. MEDICATIONS: Current Facility-Administered Medications: .   stroke: mapping our early stages of recovery book, , Does not apply, Once, Adefeso, [place], DO .  aspirin chewable tablet 81 mg, 81 mg, Oral, Daily, Sherryll Burger, Pratik D, DO .  simvastatin (ZOCOR) tablet 40 mg, 40 mg, Oral, q1800, Sherryll Burger, Pratik D, DO ANALYSIS: A 16 channel recording using standard 10 20 measurements is conducted for 24 minutes.  There is a posterior dominant rhythm that gets as high as 7-8 hertz. There is beta activity observed in the frontal areas. The recording transitions to drowsiness. There is episodic bilateral theta slowing seen in the temporal areas. Photic stimulation and hyperventilation are not carried out. There is no focal or lateral slowing.  There is no epileptiform activities noted. IMPRESSION: 1. This recording of the awake and drowsy state shows mild global slowing indicating a mild global encephalopathy. However no epileptiform activities are noted. Kofi [name], M.D. Diplomate, Biomedical engineer of Psychiatry and Neurology ( Neurology).   CT Code Stroke CTA Head W/WO contrast  Result Date: 12/17/2019 CLINICAL DATA:  Initial evaluation for possible stroke, slurred speech, left-sided facial droop. EXAM: CT ANGIOGRAPHY HEAD AND NECK CT PERFUSION BRAIN TECHNIQUE: Multidetector CT  imaging of the head and neck was performed using the standard protocol during bolus administration of intravenous contrast. Multiplanar CT image reconstructions and MIPs were obtained to evaluate the vascular anatomy. Carotid stenosis measurements (when applicable) are obtained utilizing NASCET criteria, using the distal internal carotid diameter as the denominator. Multiphase CT imaging of the brain was performed following IV bolus contrast injection. Subsequent parametric perfusion maps were calculated using RAPID software. CONTRAST:  OMNIPAQUE IOHEXOL 350 MG/ML SOLN COMPARISON:  Prior head CT from earlier the same day. FINDINGS: CTA NECK FINDINGS Aortic arch: Visualized aortic arch of normal caliber with normal 3 vessel morphology. Mild atheromatous change seen about the arch and origin of the great vessels without hemodynamically significant stenosis. Right carotid system: Right CCA patent from its origin to the bifurcation without stenosis. Scattered atheromatous change about the right bifurcation/proximal right ICA without hemodynamically significant stenosis. Right ICA patent distally to the skull base without stenosis, dissection or occlusion. Left carotid system: Left CCA patent from its origin to the bifurcation without stenosis. Mild mixed plaque about the left bifurcation/proximal left ICA without hemodynamically significant stenosis. Left ICA patent distally to the skull base without stenosis, dissection or occlusion. Vertebral arteries: Both vertebral arteries arise from the subclavian arteries. Right vertebral artery dominant. Mild atheromatous change at about the origin and proximal aspect of the right vertebral artery with no more than mild stenosis. Vertebral arteries otherwise widely patent without stenosis, dissection or occlusion. Skeleton: No acute osseous abnormality. No discrete or worrisome osseous lesions. Reversal of the normal cervical lordosis with mild to moderate cervical  spondylosis at C4-5 through C6-7. Other neck: No other acute soft tissue abnormality within the neck. No mass lesion or adenopathy. Upper chest: Visualized upper chest demonstrates no acute finding. Emphysematous changes noted within the visualized lungs. Review of the MIP images confirms the above findings CTA HEAD FINDINGS Anterior circulation: Petrous segments widely patent bilaterally. Scattered calcified plaque within the carotid siphons with associated mild to moderate multifocal narrowing, left slightly worse than right. A1 segments widely patent. Normal anterior communicating artery complex. Anterior cerebral arteries widely patent to their distal aspects without stenosis. No M1 stenosis or occlusion. Normal left MCA bifurcation. Right MCA trifurcations. Distal MCA branches well perfused and symmetric. Posterior circulation: Vertebral arteries patent to the vertebrobasilar junction without stenosis. Both posterior inferior cerebral arteries patent bilaterally. Minimal irregularity at the vertebrobasilar junction noted, favored to reflect mixing artifact. Basilar widely patent distally without stenosis. Superior cerebral arteries patent bilaterally. Both PCAs primarily supplied via the basilar and are well perfused to their distal aspects. Venous sinuses: Patent allowing for timing the contrast bolus. Anatomic variants: None significant.  No intracranial aneurysm. Review of the MIP images confirms the above findings CT Brain Perfusion Findings: ASPECTS: Not calculated on prior head CT. CBF (<30%) Volume: 0mL Perfusion (Tmax>6.0s) volume: 15mL Mismatch Volume: 15mL Infarction Location:Negative CT perfusion for acute core infarct. Apparent 15 cc area of delayed perfusion at the right occipital region, right PCA distribution. No parenchymal changes seen  within this region on prior head CT. No right PCA or posterior circulation stenosis to account for this finding. Finding of uncertain significance, but could  reflect an area of evolving ischemia. IMPRESSION: CTA HEAD AND NECK IMPRESSION: 1. Negative CTA for emergent large vessel occlusion. 2. Mild-to-moderate atheromatous change about the carotid siphons with associated mild to moderate multifocal narrowing. 3. Additional mild atheromatous change elsewhere about the major arterial vasculature of the head and neck as above. No other hemodynamically significant or correctable stenosis. 4. Aortic Atherosclerosis (ICD10-I70.0) and Emphysema (ICD10-J43.9). CT PERFUSION IMPRESSION: 1. Negative CT perfusion for acute core infarct. 2. 15 cc area of delayed perfusion at the right occipital lobe, right PCA distribution, of uncertain significance, but could reflect an area of evolving ischemia. Correlation with dedicated MRI could be performed for further evaluation as warranted. Electronically Signed   By: Rise Mu M.D.   On: 12/17/2019 23:08   CT HEAD WO CONTRAST  Result Date: 12/17/2019 CLINICAL DATA:  64 year old who fell while outside at home earlier today. When found by the family, the patient was confused, had an unsteady gait, slurred speech and possible LEFT facial droop. EXAM: CT HEAD WITHOUT CONTRAST TECHNIQUE: Contiguous axial images were obtained from the base of the skull through the vertex without intravenous contrast. COMPARISON:  02/14/2014. FINDINGS: Brain: Moderate cortical and deep atrophy, progressive since 2015. Severe changes of small vessel disease of the white matter diffusely, also progressive. No mass lesion. No midline shift. No acute hemorrhage or hematoma. No extra-axial fluid collections. No evidence of acute infarction. Vascular: Moderate BILATERAL carotid siphon atherosclerosis. No hyperdense vessel. Skull: No skull fracture or other focal osseous abnormality involving the skull. Sinuses/Orbits: Visualized paranasal sinuses, bilateral mastoid air cells and bilateral middle ear cavities well-aerated. Visualized orbits and globes  normal in appearance. Other: None. IMPRESSION: 1. No acute intracranial abnormality. 2. Moderate generalized atrophy and severe chronic microvascular ischemic changes of the white matter, progressive since 2015. Electronically Signed   By: Hulan Saas M.D.   On: 12/17/2019 17:47   CT Code Stroke CTA Neck W/WO contrast  Result Date: 12/17/2019 CLINICAL DATA:  Initial evaluation for possible stroke, slurred speech, left-sided facial droop. EXAM: CT ANGIOGRAPHY HEAD AND NECK CT PERFUSION BRAIN TECHNIQUE: Multidetector CT imaging of the head and neck was performed using the standard protocol during bolus administration of intravenous contrast. Multiplanar CT image reconstructions and MIPs were obtained to evaluate the vascular anatomy. Carotid stenosis measurements (when applicable) are obtained utilizing NASCET criteria, using the distal internal carotid diameter as the denominator. Multiphase CT imaging of the brain was performed following IV bolus contrast injection. Subsequent parametric perfusion maps were calculated using RAPID software. CONTRAST:  OMNIPAQUE IOHEXOL 350 MG/ML SOLN COMPARISON:  Prior head CT from earlier the same day. FINDINGS: CTA NECK FINDINGS Aortic arch: Visualized aortic arch of normal caliber with normal 3 vessel morphology. Mild atheromatous change seen about the arch and origin of the great vessels without hemodynamically significant stenosis. Right carotid system: Right CCA patent from its origin to the bifurcation without stenosis. Scattered atheromatous change about the right bifurcation/proximal right ICA without hemodynamically significant stenosis. Right ICA patent distally to the skull base without stenosis, dissection or occlusion. Left carotid system: Left CCA patent from its origin to the bifurcation without stenosis. Mild mixed plaque about the left bifurcation/proximal left ICA without hemodynamically significant stenosis. Left ICA patent distally to the skull  base without stenosis, dissection or occlusion. Vertebral arteries: Both vertebral arteries arise from the  subclavian arteries. Right vertebral artery dominant. Mild atheromatous change at about the origin and proximal aspect of the right vertebral artery with no more than mild stenosis. Vertebral arteries otherwise widely patent without stenosis, dissection or occlusion. Skeleton: No acute osseous abnormality. No discrete or worrisome osseous lesions. Reversal of the normal cervical lordosis with mild to moderate cervical spondylosis at C4-5 through C6-7. Other neck: No other acute soft tissue abnormality within the neck. No mass lesion or adenopathy. Upper chest: Visualized upper chest demonstrates no acute finding. Emphysematous changes noted within the visualized lungs. Review of the MIP images confirms the above findings CTA HEAD FINDINGS Anterior circulation: Petrous segments widely patent bilaterally. Scattered calcified plaque within the carotid siphons with associated mild to moderate multifocal narrowing, left slightly worse than right. A1 segments widely patent. Normal anterior communicating artery complex. Anterior cerebral arteries widely patent to their distal aspects without stenosis. No M1 stenosis or occlusion. Normal left MCA bifurcation. Right MCA trifurcations. Distal MCA branches well perfused and symmetric. Posterior circulation: Vertebral arteries patent to the vertebrobasilar junction without stenosis. Both posterior inferior cerebral arteries patent bilaterally. Minimal irregularity at the vertebrobasilar junction noted, favored to reflect mixing artifact. Basilar widely patent distally without stenosis. Superior cerebral arteries patent bilaterally. Both PCAs primarily supplied via the basilar and are well perfused to their distal aspects. Venous sinuses: Patent allowing for timing the contrast bolus. Anatomic variants: None significant.  No intracranial aneurysm. Review of the MIP images  confirms the above findings CT Brain Perfusion Findings: ASPECTS: Not calculated on prior head CT. CBF (<30%) Volume: 0mL Perfusion (Tmax>6.0s) volume: 15mL Mismatch Volume: 15mL Infarction Location:Negative CT perfusion for acute core infarct. Apparent 15 cc area of delayed perfusion at the right occipital region, right PCA distribution. No parenchymal changes seen within this region on prior head CT. No right PCA or posterior circulation stenosis to account for this finding. Finding of uncertain significance, but could reflect an area of evolving ischemia. IMPRESSION: CTA HEAD AND NECK IMPRESSION: 1. Negative CTA for emergent large vessel occlusion. 2. Mild-to-moderate atheromatous change about the carotid siphons with associated mild to moderate multifocal narrowing. 3. Additional mild atheromatous change elsewhere about the major arterial vasculature of the head and neck as above. No other hemodynamically significant or correctable stenosis. 4. Aortic Atherosclerosis (ICD10-I70.0) and Emphysema (ICD10-J43.9). CT PERFUSION IMPRESSION: 1. Negative CT perfusion for acute core infarct. 2. 15 cc area of delayed perfusion at the right occipital lobe, right PCA distribution, of uncertain significance, but could reflect an area of evolving ischemia. Correlation with dedicated MRI could be performed for further evaluation as warranted. Electronically Signed   By: Rise Mu M.D.   On: 12/17/2019 23:08   MR BRAIN WO CONTRAST  Result Date: 12/18/2019 CLINICAL DATA:  Slurred speech and left-sided facial droop. Confusion. EXAM: MRI HEAD WITHOUT CONTRAST TECHNIQUE: Multiplanar, multiecho pulse sequences of the brain and surrounding structures were obtained without intravenous contrast. COMPARISON:  Head CT, CTA, and CTP 12/17/2019 FINDINGS: The coronal T2 sequence is severely motion degraded, and there is milder motion on other sequences. Brain: There is a 1 cm acute infarct in the left paramedian pons, and  there is an acute 4 mm infarct in the cerebellar vermis. Additional subcentimeter acute to early subacute infarcts are questioned in the superior aspect of the left cerebellar hemisphere, right occipital lobe, and left temporo-occipital junction. 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 Patchy T2 hyperintensities in the  cerebral white matter bilaterally and in the pons are nonspecific but compatible with extensive chronic small vessel ischemic disease. Small chronic infarcts are noted in the right middle cerebellar peduncle, pons, thalami, basal ganglia, and right corona radiata. There is mild cerebral atrophy. No mass, midline shift, or extra-axial fluid collection is identified. Vascular: Major intracranial vascular flow voids are preserved. Skull and upper cervical spine: Unremarkable bone marrow signal. Sinuses/Orbits: Unremarkable orbits. Minimal mucosal thickening in the right maxillary sinus. Clear mastoid air cells. Other: None. IMPRESSION: 1. Small acute posterior circulation infarcts most notable in the pons. 2. Extensive chronic small vessel ischemic disease with multiple chronic infarcts. 3. Numerous chronic microhemorrhages likely related to chronic hypertension. Electronically Signed   By: Sebastian Ache M.D.   On: 12/18/2019 09:22   CT Code Stroke Cerebral Perfusion with contrast  Result Date: 12/17/2019 CLINICAL DATA:  Initial evaluation for possible stroke, slurred speech, left-sided facial droop. EXAM: CT ANGIOGRAPHY HEAD AND NECK CT PERFUSION BRAIN TECHNIQUE: Multidetector CT imaging of the head and neck was performed using the standard protocol during bolus administration of intravenous contrast. Multiplanar CT image reconstructions and MIPs were obtained to evaluate the vascular anatomy. Carotid stenosis measurements (when applicable) are obtained utilizing NASCET criteria, using the distal internal  carotid diameter as the denominator. Multiphase CT imaging of the brain was performed following IV bolus contrast injection. Subsequent parametric perfusion maps were calculated using RAPID software. CONTRAST:  OMNIPAQUE IOHEXOL 350 MG/ML SOLN COMPARISON:  Prior head CT from earlier the same day. FINDINGS: CTA NECK FINDINGS Aortic arch: Visualized aortic arch of normal caliber with normal 3 vessel morphology. Mild atheromatous change seen about the arch and origin of the great vessels without hemodynamically significant stenosis. Right carotid system: Right CCA patent from its origin to the bifurcation without stenosis. Scattered atheromatous change about the right bifurcation/proximal right ICA without hemodynamically significant stenosis. Right ICA patent distally to the skull base without stenosis, dissection or occlusion. Left carotid system: Left CCA patent from its origin to the bifurcation without stenosis. Mild mixed plaque about the left bifurcation/proximal left ICA without hemodynamically significant stenosis. Left ICA patent distally to the skull base without stenosis, dissection or occlusion. Vertebral arteries: Both vertebral arteries arise from the subclavian arteries. Right vertebral artery dominant. Mild atheromatous change at about the origin and proximal aspect of the right vertebral artery with no more than mild stenosis. Vertebral arteries otherwise widely patent without stenosis, dissection or occlusion. Skeleton: No acute osseous abnormality. No discrete or worrisome osseous lesions. Reversal of the normal cervical lordosis with mild to moderate cervical spondylosis at C4-5 through C6-7. Other neck: No other acute soft tissue abnormality within the neck. No mass lesion or adenopathy. Upper chest: Visualized upper chest demonstrates no acute finding. Emphysematous changes noted within the visualized lungs. Review of the MIP images confirms the above findings CTA HEAD FINDINGS Anterior  circulation: Petrous segments widely patent bilaterally. Scattered calcified plaque within the carotid siphons with associated mild to moderate multifocal narrowing, left slightly worse than right. A1 segments widely patent. Normal anterior communicating artery complex. Anterior cerebral arteries widely patent to their distal aspects without stenosis. No M1 stenosis or occlusion. Normal left MCA bifurcation. Right MCA trifurcations. Distal MCA branches well perfused and symmetric. Posterior circulation: Vertebral arteries patent to the vertebrobasilar junction without stenosis. Both posterior inferior cerebral arteries patent bilaterally. Minimal irregularity at the vertebrobasilar junction noted, favored to reflect mixing artifact. Basilar widely patent distally without stenosis. Superior cerebral arteries patent bilaterally. Both  PCAs primarily supplied via the basilar and are well perfused to their distal aspects. Venous sinuses: Patent allowing for timing the contrast bolus. Anatomic variants: None significant.  No intracranial aneurysm. Review of the MIP images confirms the above findings CT Brain Perfusion Findings: ASPECTS: Not calculated on prior head CT. CBF (<30%) Volume: 22mL Perfusion (Tmax>6.0s) volume: 53mL Mismatch Volume: 53mL Infarction Location:Negative CT perfusion for acute core infarct. Apparent 15 cc area of delayed perfusion at the right occipital region, right PCA distribution. No parenchymal changes seen within this region on prior head CT. No right PCA or posterior circulation stenosis to account for this finding. Finding of uncertain significance, but could reflect an area of evolving ischemia. IMPRESSION: CTA HEAD AND NECK IMPRESSION: 1. Negative CTA for emergent large vessel occlusion. 2. Mild-to-moderate atheromatous change about the carotid siphons with associated mild to moderate multifocal narrowing. 3. Additional mild atheromatous change elsewhere about the major arterial vasculature  of the head and neck as above. No other hemodynamically significant or correctable stenosis. 4. Aortic Atherosclerosis (ICD10-I70.0) and Emphysema (ICD10-J43.9). CT PERFUSION IMPRESSION: 1. Negative CT perfusion for acute core infarct. 2. 15 cc area of delayed perfusion at the right occipital lobe, right PCA distribution, of uncertain significance, but could reflect an area of evolving ischemia. Correlation with dedicated MRI could be performed for further evaluation as warranted. Electronically Signed   By: Rise Mu M.D.   On: 12/17/2019 23:08   DG Swallowing Func-Speech Pathology  Result Date: 12/20/2019 Objective Swallowing Evaluation: Type of Study: MBS-Modified Barium Swallow Study  Patient Details Name: RAHIL PASSEY MRN: [id number] Date of Birth: 11/08/1955 Today's Date: 12/20/2019 Time: SLP Start Time (ACUTE ONLY): 1000 -SLP Stop Time (ACUTE ONLY): 1025 SLP Time Calculation (min) (ACUTE ONLY): 25 min Past Medical History: Past Medical History: Diagnosis Date . Hypertension  Past Surgical History: Past Surgical History: Procedure Laterality Date . NECK SURGERY   HPI: KRAYTON WORTLEY is a 64 y.o. male with medical history significant for hypertension who presents to the emergency department via EMS. Patient was unable to provide history due to speech difficulty. History was obtained from ED physician and from ED medical record. Per report, family noted patient to be confused and unsteady on his feet, so EMS was activated, last known well time was possibly in the morning, he had falling in the yard and was presumed to be laying there for about an hour prior to being noted by family.  He was said to complain of generalized weakness. MRI shows: There is a 1 cm acute infarct in the left paramedian pons, and there is an acute 4 mm infarct in the cerebellar vermis. Additional subcentimeter acute to early subacute infarcts are questioned in the superior aspect of the left cerebellar hemisphere,  right occipital lobe, and left temporo-occipital junction. 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 BSE and SLE ordered. Pt failed the Yale swallow screen in the ER.  Subjective: "Okay" Assessment / Plan / Recommendation CHL IP CLINICAL IMPRESSIONS 12/20/2019 Clinical Impression Pt presents with mild to mild/mod oral phase and mild pharyngeal phase dysphagia characterized by reduced lingual strength resulting in reduced bolus control/cohesion, piecemeal deglutition, premature spillage, and min oral residue; pharyngeal phase is characterized by premature spillage/delay in swallow initiation and reduced laryngeal closure resulting in variable sensed and silent aspiration in trace to min amounts with thins and NTL. Pt did well with teaspoon presentations or small cup sips of thin  when cued to hold the bolus orally and then swallow. Aspiration generally occurred when taking larger sips and with straw (Pt needs cueing for small sips). Chin tuck with head turn R was ineffective. Recommend D3/mech soft and NTL via small cup sips only, no straws, po medications whole in puree, alternate solids/liquids, and trials tsp thin/cup thin water with SLP at SNF.  SLP Visit Diagnosis Dysphagia, oropharyngeal phase (R13.12) Attention and concentration deficit following -- Frontal lobe and executive function deficit following -- Impact on safety and function Mild aspiration risk;Moderate aspiration risk   CHL IP TREATMENT RECOMMENDATION 12/20/2019 Treatment Recommendations Therapy as outlined in treatment plan below   Prognosis 12/20/2019 Prognosis for Safe Diet Advancement Good Barriers to Reach Goals (No Data) Barriers/Prognosis Comment -- CHL IP DIET RECOMMENDATION 12/20/2019 SLP Diet Recommendations Dysphagia 3 (Mech soft) solids;Nectar thick liquid Liquid Administration via Cup;No straw Medication Administration Whole meds with puree  Compensations Small sips/bites;Lingual sweep for clearance of pocketing;Follow solids with liquid Postural Changes Remain semi-upright after after feeds/meals (Comment);Seated upright at 90 degrees   CHL IP OTHER RECOMMENDATIONS 12/20/2019 Recommended Consults -- Oral Care Recommendations Oral care BID;Staff/trained caregiver to provide oral care Other Recommendations Prohibited food (jello, ice cream, thin soups);Order thickener from pharmacy;Clarify dietary restrictions   CHL IP FOLLOW UP RECOMMENDATIONS 12/20/2019 Follow up Recommendations Skilled Nursing facility   [facility] IP FREQUENCY AND DURATION 12/20/2019 Speech Therapy Frequency (ACUTE ONLY) min 2x/week Treatment Duration 2 weeks      CHL IP ORAL PHASE 12/20/2019 Oral Phase Impaired Oral - Pudding Teaspoon -- Oral - Pudding Cup -- Oral - Honey Teaspoon -- Oral - Honey Cup -- Oral - Nectar Teaspoon -- Oral - Nectar Cup Incomplete tongue to palate contact;Lingual/palatal residue Oral - Nectar Straw WFL Oral - Thin Teaspoon WFL;Premature spillage Oral - Thin Cup Premature spillage;Lingual/palatal residue;Decreased bolus cohesion Oral - Thin Straw -- Oral - Puree Weak lingual manipulation;Lingual/palatal residue;Piecemeal swallowing;Decreased bolus cohesion Oral - Mech Soft -- Oral - Regular Delayed oral transit;Piecemeal swallowing;Lingual/palatal residue;Decreased bolus cohesion Oral - Multi-Consistency -- Oral - Pill Premature spillage Oral Phase - Comment --  CHL IP PHARYNGEAL PHASE 12/20/2019 Pharyngeal Phase Impaired Pharyngeal- Pudding Teaspoon -- Pharyngeal -- Pharyngeal- Pudding Cup -- Pharyngeal -- Pharyngeal- Honey Teaspoon -- Pharyngeal -- Pharyngeal- Honey Cup -- Pharyngeal -- Pharyngeal- Nectar Teaspoon -- Pharyngeal -- Pharyngeal- Nectar Cup Delayed swallow initiation-pyriform sinuses Pharyngeal -- Pharyngeal- Nectar Straw Delayed swallow initiation-pyriform sinuses;Reduced airway/laryngeal closure;Penetration/Aspiration during  swallow;Penetration/Apiration after swallow;Trace aspiration Pharyngeal Material enters airway, passes BELOW cords and not ejected out despite cough attempt by patient Pharyngeal- Thin Teaspoon Delayed swallow initiation-pyriform sinuses;Reduced airway/laryngeal closure;Penetration/Aspiration during swallow;Trace aspiration Pharyngeal Material does not enter airway;Material enters airway, passes BELOW cords and not ejected out despite cough attempt by patient Pharyngeal- Thin Cup Delayed swallow initiation-pyriform sinuses;Reduced airway/laryngeal closure;Penetration/Aspiration during swallow;Penetration/Apiration after swallow;Trace aspiration;Moderate aspiration Pharyngeal Material enters airway, CONTACTS cords and not ejected out;Material enters airway, passes BELOW cords and not ejected out despite cough attempt by patient Pharyngeal- Thin Straw -- Pharyngeal -- Pharyngeal- Puree Delayed swallow initiation-vallecula;Delayed swallow initiation-pyriform sinuses Pharyngeal -- Pharyngeal- Mechanical Soft -- Pharyngeal -- Pharyngeal- Regular Delayed swallow initiation-pyriform sinuses Pharyngeal -- Pharyngeal- Multi-consistency -- Pharyngeal -- Pharyngeal- Pill (No Data) Pharyngeal -- Pharyngeal Comment --  CHL IP CERVICAL ESOPHAGEAL PHASE 12/20/2019 Cervical Esophageal Phase WFL Pudding Teaspoon -- Pudding Cup -- Honey Teaspoon -- Honey Cup -- Nectar Teaspoon -- Nectar Cup -- Nectar Straw -- Thin Teaspoon -- Thin Cup -- Thin Straw -- Puree -- Mechanical Soft --  Regular -- Multi-consistency -- Pill -- Cervical Esophageal Comment -- Thank you, Havery Moros, CCC-SLP [phone] PORTER,DABNEY 12/20/2019, 11:31 AM              ECHOCARDIOGRAM COMPLETE  Result Date: 12/18/2019    ECHOCARDIOGRAM REPORT   Patient Name:   SHAHID FLORI Date of Exam: 12/18/2019 Medical Rec #:  [id number]          Height:       67.0 in Accession #:    [id number]         Weight:       148.8 lb Date of Birth:  07/24/1955           BSA:           1.783 m Patient Age:    64 years           BP:           188/92 mmHg Patient Gender: M                  HR:           49 bpm. [facility] Procedure: 2D Echo, Cardiac Doppler and Color Doppler Indications:    Stroke 434.91 / I163.9  History:        Patient has no prior history of Echocardiogram examinations.                 Risk Factors:Hypertension. Acute CVA (cerebrovascular accident)                 this admit.  Sonographer:    Celesta Gentile RCS Referring Phys: [id number] OLADAPO ADEFESO IMPRESSIONS  1. Left ventricular ejection fraction, by estimation, is 60 to 65%. The left ventricle has normal function. The left ventricle has no regional wall motion abnormalities. There is moderate left ventricular hypertrophy. Left ventricular diastolic parameters are consistent with Grade I diastolic dysfunction (impaired relaxation).  2. Right ventricular systolic function is normal. The right ventricular size is normal.  3. Left atrial size was mildly dilated.  4. The mitral valve is normal in structure. Mild to moderate mitral valve regurgitation. No evidence of mitral stenosis.  5. The aortic valve has an indeterminant number of cusps. There is moderate calcification of the aortic valve. There is moderate thickening of the aortic valve. Aortic valve regurgitation is not visualized. No aortic stenosis is present.  6. The inferior vena cava is normal in size with greater than 50% respiratory variability, suggesting right atrial pressure of 3 mmHg. FINDINGS  Left Ventricle: Left ventricular ejection fraction, by estimation, is 60 to 65%. The left ventricle has normal function. The left ventricle has no regional wall motion abnormalities. The left ventricular internal cavity size was normal in size. There is  moderate left ventricular hypertrophy. Left ventricular diastolic parameters are consistent with Grade I diastolic dysfunction (impaired relaxation). Normal left ventricular filling pressure. Right  Ventricle: The right ventricular size is normal. No increase in right ventricular wall thickness. Right ventricular systolic function is normal. Left Atrium: Left atrial size was mildly dilated. Right Atrium: Right atrial size was normal in size. Pericardium: There is no evidence of pericardial effusion. Mitral Valve: The mitral valve is normal in structure. Mild to moderate mitral valve regurgitation. No evidence of mitral valve stenosis. Tricuspid Valve: The tricuspid valve is normal in structure. Tricuspid valve regurgitation is mild . No evidence of tricuspid stenosis. Aortic Valve: The aortic valve has an indeterminant number of cusps. There is  moderate calcification of the aortic valve. There is moderate thickening of the aortic valve. There is moderate aortic valve annular calcification. Aortic valve regurgitation is not visualized. No aortic stenosis is present. Aortic valve mean gradient measures 5.3 mmHg. Aortic valve peak gradient measures 12.2 mmHg. Aortic valve area, by VTI measures 2.07 cm. Pulmonic Valve: The pulmonic valve was not well visualized. Pulmonic valve regurgitation is not visualized. No evidence of pulmonic stenosis. Aorta: The aortic root is normal in size and structure. Pulmonary Artery: Indeterminant PASP, inadequate TR jet. Venous: The inferior vena cava is normal in size with greater than 50% respiratory variability, suggesting right atrial pressure of 3 mmHg. IAS/Shunts: No atrial level shunt detected by color flow Doppler.  LEFT VENTRICLE PLAX 2D LVIDd:         4.79 cm  Diastology LVIDs:         3.41 cm  LV e' medial:    6.74 cm/s LV PW:         1.31 cm  LV E/e' medial:  9.5 LV IVS:        1.22 cm  LV e' lateral:   7.29 cm/s LVOT diam:     2.00 cm  LV E/e' lateral: 8.8 LV SV:         75 LV SV Index:   42 LVOT Area:     3.14 cm  RIGHT VENTRICLE RV S prime:     11.90 cm/s TAPSE (M-mode): 2.0 cm LEFT ATRIUM             Index       RIGHT ATRIUM           Index LA diam:        4.60 cm  2.58 cm/m  RA Area:     15.80 cm LA Vol (A2C):   75.0 ml 42.05 ml/m RA Volume:   42.00 ml  23.55 ml/m LA Vol (A4C):   53.4 ml 29.94 ml/m LA Biplane Vol: 67.5 ml 37.85 ml/m  AORTIC VALVE AV Area (Vmax):    2.02 cm AV Area (Vmean):   1.87 cm AV Area (VTI):     2.07 cm AV Vmax:           174.52 cm/s AV Vmean:          106.786 cm/s AV VTI:            0.361 m AV Peak Grad:      12.2 mmHg AV Mean Grad:      5.3 mmHg LVOT Vmax:         112.00 cm/s LVOT Vmean:        63.400 cm/s LVOT VTI:          0.238 m LVOT/AV VTI ratio: 0.66  AORTA Ao Root diam: 3.10 cm MITRAL VALVE                 TRICUSPID VALVE MV Area (PHT): 2.39 cm      TR Peak grad:   18.1 mmHg MV Decel Time: 317 msec      TR Vmax:        213.00 cm/s MR Peak grad:    164.9 mmHg MR Mean grad:    84.0 mmHg   SHUNTS MR Vmax:         642.00 cm/s Systemic VTI:  0.24 m MR Vmean:        415.0 cm/s  Systemic Diam: 2.00 cm MR PISA:         3.08  cm MR PISA Eff ROA: 12 mm MR PISA Radius:  0.70 cm MV E velocity: 64.30 cm/s MV A velocity: 101.00 cm/s MV E/A ratio:  0.64 Dina Rich MD Electronically signed by Dina Rich MD Signature Date/Time: 12/18/2019/3:56:54 PM    Final      Subjective: Pt without complaints.  He is agreeable to SNF placement.    Discharge Exam: Vitals:   12/24/19 2041 12/25/19 0616  BP: (!) 162/91 (!) 164/83  Pulse: 73 (!) 56  Resp:  16  Temp: 98.6 F (37 C) (!) 97.5 F (36.4 C)  SpO2: 97% 98%   Vitals:   12/24/19 1557 12/24/19 2003 12/24/19 2041 12/25/19 0616  BP: (!) 152/83  (!) 162/91 (!) 164/83  Pulse: (!) 53  73 (!) 56  Resp: 20   16  Temp: 97.6 F (36.4 C)  98.6 F (37 C) (!) 97.5 F (36.4 C)  TempSrc:    Oral  SpO2: 100% 93% 97% 98%  Weight:      Height:       General exam: Appears calm and comfortable  Respiratory system: Clear to auscultation. Respiratory effort normal. Cardiovascular system: normal S1 & S2 heard.  Gastrointestinal system: Abdomen is nondistended, soft and nontender.  Central  nervous system: Alert and awake.  No severe deficits.  Extremities: No edema, no cyanosis, no clubbing.  Skin: No rashes, lesions or ulcers Psychiatry: normal affect today.    The results of significant diagnostics from this hospitalization (including imaging, microbiology, ancillary and laboratory) are listed below for reference.     Microbiology: Recent Results (from the past 240 hour(s))  SARS Coronavirus 2 by RT PCR (hospital order, performed in [facility] hospital lab) Nasopharyngeal Nasopharyngeal Swab     Status: None   Collection Time: 12/17/19  5:55 PM   Specimen: Nasopharyngeal Swab  Result Value Ref Range Status   SARS Coronavirus 2 NEGATIVE NEGATIVE Final    Comment: (NOTE) SARS-CoV-2 target nucleic acids are NOT DETECTED.  The SARS-CoV-2 RNA is generally detectable in upper and lower respiratory specimens during the acute phase of infection. The lowest concentration of SARS-CoV-2 viral copies this assay can detect is 250 copies / mL. A negative result does not preclude SARS-CoV-2 infection and should not be used as the sole basis for treatment or other patient management decisions.  A negative result may occur with improper specimen collection / handling, submission of specimen other than nasopharyngeal swab, presence of viral mutation(s) within the areas targeted by this assay, and inadequate number of viral copies (<250 copies / mL). A negative result must be combined with clinical observations, patient history, and epidemiological information.  Fact Sheet for Patients:   BoilerBrush.com.cy  Fact Sheet for Healthcare Providers: https://pope.com/  This test is not yet approved or  cleared by the [place] FDA and has been authorized for detection and/or diagnosis of SARS-CoV-2 by FDA under an Emergency Use Authorization (EUA).  This EUA will remain in effect (meaning this test can be used) for the duration of  the COVID-19 declaration under Section 564(b)(1) of the Act, 21 U.S.C. section 360bbb-3(b)(1), unless the authorization is terminated or revoked sooner.  Performed at [facility], [address] [place], [place] [address]   SARS Coronavirus 2 by RT PCR (hospital order, performed in [facility] hospital lab) Nasopharyngeal Nasopharyngeal Swab     Status: None   Collection Time: 12/22/19 10:22 AM   Specimen: Nasopharyngeal Swab  Result Value Ref Range Status   SARS Coronavirus 2  NEGATIVE NEGATIVE Final    Comment: (NOTE) SARS-CoV-2 target nucleic acids are NOT DETECTED.  The SARS-CoV-2 RNA is generally detectable in upper and lower respiratory specimens during the acute phase of infection. The lowest concentration of SARS-CoV-2 viral copies this assay can detect is 250 copies / mL. A negative result does not preclude SARS-CoV-2 infection and should not be used as the sole basis for treatment or other patient management decisions.  A negative result may occur with improper specimen collection / handling, submission of specimen other than nasopharyngeal swab, presence of viral mutation(s) within the areas targeted by this assay, and inadequate number of viral copies (<250 copies / mL). A negative result must be combined with clinical observations, patient history, and epidemiological information.  Fact Sheet for Patients:   BoilerBrush.com.cy  Fact Sheet for Healthcare Providers: https://pope.com/  This test is not yet approved or  cleared by the [place] FDA and has been authorized for detection and/or diagnosis of SARS-CoV-2 by FDA under an Emergency Use Authorization (EUA).  This EUA will remain in effect (meaning this test can be used) for the duration of the COVID-19 declaration under Section 564(b)(1) of the Act, 21 U.S.C. section 360bbb-3(b)(1), unless the authorization is terminated or revoked sooner.  Performed at  [facility], [address] [place], [place] [address]      Labs: BNP (last 3 results) No results for input(s): BNP in the last 8760 hours. Basic Metabolic Panel: Recent Labs  Lab 12/19/19 0624 12/20/19 0627  NA 140 138  K 3.5 3.6  CL 108 108  CO2 22 22  GLUCOSE 110* 103*  BUN 9 10  CREATININE 0.87 0.85  CALCIUM 9.3 9.1  MG 2.1 2.2   Liver Function Tests: No results for input(s): AST, ALT, ALKPHOS, BILITOT, PROT, ALBUMIN in the last 168 hours. No results for input(s): LIPASE, AMYLASE in the last 168 hours. No results for input(s): AMMONIA in the last 168 hours. CBC: Recent Labs  Lab 12/19/19 0624 12/20/19 0627  WBC 10.2 6.8  HGB 13.0 12.4*  HCT 40.1 37.7*  MCV 77.6* 77.3*  PLT 246 232   Cardiac Enzymes: No results for input(s): CKTOTAL, CKMB, CKMBINDEX, TROPONINI in the last 168 hours. BNP: Invalid input(s): POCBNP CBG: No results for input(s): GLUCAP in the last 168 hours. D-Dimer No results for input(s): DDIMER in the last 72 hours. Hgb A1c No results for input(s): HGBA1C in the last 72 hours. Lipid Profile No results for input(s): CHOL, HDL, LDLCALC, TRIG, CHOLHDL, LDLDIRECT in the last 72 hours. Thyroid function studies No results for input(s): TSH, T4TOTAL, T3FREE, THYROIDAB in the last 72 hours.  Invalid input(s): FREET3 Anemia work up No results for input(s): VITAMINB12, FOLATE, FERRITIN, TIBC, IRON, RETICCTPCT in the last 72 hours. Urinalysis    Component Value Date/Time   COLORURINE STRAW (A) 12/17/2019 2315   APPEARANCEUR CLEAR 12/17/2019 2315   LABSPEC 1.032 (H) 12/17/2019 2315   PHURINE 6.0 12/17/2019 2315   GLUCOSEU NEGATIVE 12/17/2019 2315   HGBUR NEGATIVE 12/17/2019 2315   BILIRUBINUR NEGATIVE 12/17/2019 2315   KETONESUR NEGATIVE 12/17/2019 2315   PROTEINUR NEGATIVE 12/17/2019 2315   UROBILINOGEN 1.0 02/14/2014 1611   NITRITE NEGATIVE 12/17/2019 2315   LEUKOCYTESUR NEGATIVE 12/17/2019 2315   Sepsis Labs Invalid input(s):  PROCALCITONIN,  WBC,  LACTICIDVEN Microbiology Recent Results (from the past 240 hour(s))  SARS Coronavirus 2 by RT PCR (hospital order, performed in [facility] hospital lab) Nasopharyngeal Nasopharyngeal Swab     Status: None  Collection Time: 12/17/19  5:55 PM   Specimen: Nasopharyngeal Swab  Result Value Ref Range Status   SARS Coronavirus 2 NEGATIVE NEGATIVE Final    Comment: (NOTE) SARS-CoV-2 target nucleic acids are NOT DETECTED.  The SARS-CoV-2 RNA is generally detectable in upper and lower respiratory specimens during the acute phase of infection. The lowest concentration of SARS-CoV-2 viral copies this assay can detect is 250 copies / mL. A negative result does not preclude SARS-CoV-2 infection and should not be used as the sole basis for treatment or other patient management decisions.  A negative result may occur with improper specimen collection / handling, submission of specimen other than nasopharyngeal swab, presence of viral mutation(s) within the areas targeted by this assay, and inadequate number of viral copies (<250 copies / mL). A negative result must be combined with clinical observations, patient history, and epidemiological information.  Fact Sheet for Patients:   BoilerBrush.com.cyhttps://www.fda.gov/media/136312/download  Fact Sheet for Healthcare Providers: https://pope.com/https://www.fda.gov/media/136313/download  This test is not yet approved or  cleared by the [place] FDA and has been authorized for detection and/or diagnosis of SARS-CoV-2 by FDA under an Emergency Use Authorization (EUA).  This EUA will remain in effect (meaning this test can be used) for the duration of the COVID-19 declaration under Section 564(b)(1) of the Act, 21 U.S.C. section 360bbb-3(b)(1), unless the authorization is terminated or revoked sooner.  Performed at [facility], [address] [place], [place] [address]   SARS Coronavirus 2 by RT PCR (hospital order, [facility]) Nasopharyngeal Nasopharyngeal Swab     Status: None   Collection Time: 12/22/19 10:22 AM   Specimen: Nasopharyngeal Swab  Result Value Ref Range Status   SARS Coronavirus 2 NEGATIVE NEGATIVE Final    Comment: (NOTE) SARS-CoV-2 target nucleic acids are NOT DETECTED.  The SARS-CoV-2 RNA is generally detectable in upper and lower respiratory specimens during the acute phase of infection. The lowest concentration of SARS-CoV-2 viral copies this assay can detect is 250 copies / mL. A negative result does not preclude SARS-CoV-2 infection and should not be used as the sole basis for treatment or other patient management decisions.  A negative result may occur with improper specimen collection / handling, submission of specimen other than nasopharyngeal swab, presence of viral mutation(s) within the areas targeted by this assay, and inadequate number of viral copies (<250 copies / mL). A negative result must be combined with clinical observations, patient history, and epidemiological information.  Fact Sheet for Patients:   BoilerBrush.com.cyhttps://www.fda.gov/media/136312/download  Fact Sheet for Healthcare Providers: https://pope.com/https://www.fda.gov/media/136313/download  This test is not yet approved or  cleared by the [place] FDA and has been authorized for detection and/or diagnosis of SARS-CoV-2 by FDA under an Emergency Use Authorization (EUA).  This EUA will remain in effect (meaning this test can be used) for the duration of the COVID-19 declaration under Section 564(b)(1) of the Act, 21 U.S.C. section 360bbb-3(b)(1), unless the authorization is terminated or revoked sooner.  Performed at [facility], [address] [place], [place] [id number]    Time coordinating discharge: 39 mins  SIGNED:  Standley Dakinslanford Nijae Doyel, MD  [facility] 12/25/2019, 10:46 AM How to contact the [facility] Attending or Consulting provider 7A - 7P or covering provider during after hours 7P -7A, for this patient?   1. Check the care team in [facility] and look for a) attending/consulting [facility] provider listed and b) the [facility] team listed 2. Log into [email or web address] and use [facility]'s universal password to  access. If you do not have the password, please contact the hospital operator. 3. Locate the [facility] provider you are looking for under [facility] and page to a number that you can be directly reached. 4. If you still have difficulty reaching the provider, please page the [facility] (Director on Call) for the Hospitalists listed on amion for assistance.

## 2019-12-22 NOTE — Discharge Instructions (Signed)
Rehabilitation After a Stroke, Adult A stroke causes damage to the brain cells, which can affect your ability to walk, talk, or remember things. The impact of a stroke is different for everyone, and so is recovery. Some people have progress during the first few days after treatment. Others may take weeks or longer to make progress. Stroke rehabilitation includes a variety of treatments to help you recover and promote your independence after a stroke. You may not be able do everything that you did before the stroke, but you can learn ways to manage your lifestyle and be as independent as possible. Rehabilitation will start as soon as you are able to participate after your stroke, and it involves care from a team that may include:  Family and friends. Your loved ones know you best and can be very helpful in your recovery.  Physicians.  Nurses.  Physical and occupational therapists.  Speech-language therapists.  A nutritionist.  A psychologist.  A social worker. Keep open communication with all members of your care team. Share your medical records if needed, and take notes about each provider's recommendations. What is physical therapy? Physical therapists (PTs) help you to improve your coordination, balance, and muscle strength. Physical therapy may involve:  Range of motion exercises.  Help to move between lying, sitting, and standing positions.  Walking with a cane or walker, if needed.  Help using stairs. What is occupational therapy? Occupational therapists (OTs) help you rebuild your ability to do everyday tasks, such as brushing your teeth, going to the bathroom, eating, and getting dressed. Occupational therapy may also help with:  Vision. Visual scanning is a technique that is used to prevent falls.  Memory and cognitive training. This therapy includes problem-solving techniques and relearning tasks like making a phone call.  Fine muscle movements such as buttoning a shirt  or picking up small objects. What is speech therapy? Speech-language therapists help you communicate. After a stroke, you may have problems understanding what people are saying, or you may have trouble writing, speaking, or finding the right word for what you want to say. You may also need speech therapy if you have difficulty swallowing while eating and drinking. Examples of speech-language therapies include:  Techniques to strengthen muscles used in swallowing.  Naming objects or describing pictures. This helps retrain the brain to recognize and remember words.  Exercises to strengthen the muscles involved in talking, including your tongue and lips.  Exercises to retrain your brain in understanding what you read and hear. How often will I need therapy? Therapy will begin as soon as you are able to participate, which is often within the first few days after a stroke. Sessions will be frequent at first. For example, you may have therapy 2-3 hours a day on most days of the week during the first few months. The intensity depends on the type and severity of your stroke. You may need therapy for several months. Therapy may take place in the hospital, at a rehabilitation center, or in your home. Are there any side effects of therapy? Therapy is safe and is usually well-tolerated. You may feel physically and mentally tired after therapy, especially during the first few weeks. Rest before therapy sessions if you need to so you can get the most out of your rehabilitation. Follow these instructions at home:  Involve your family and friends in your recovery, if possible. Having another person to encourage you is beneficial.  Follow instructions from your speech-language therapist, nutritionist, or health care   provider about what you can safely eat and drink. Eat healthy foods. If your ability to swallow was affected by the stroke, you may need to take steps to avoid choking, such as: ? Taking small bites  when eating. ? Eating foods that are soft or pureed. ? Drinking liquids that have been thickened.  Maintain social connections and interactions with friends, family, and community groups. This is an important part of your recovery. Communication challenges and physical challenges may cause you to feel isolated after a stroke.  Consider joining a support group that allows you to talk about the impact of stroke on your life. A psychologist or counselor may be recommended. Your emotional recovery from stroke is just as important as your physical recovery.  Keep all follow-up visits as told by your health care providers. This is important. Summary  Stroke rehabilitation includes a variety of treatments to help you recover and promote your independence after a stroke.  Rehabilitation will start as soon as you are able to participate after your stroke, and it includes care from a team of experts.  The intensity of therapy depends on the type and severity of your stroke. You may need therapy for several months. This information is not intended to replace advice given to you by your health care provider. Make sure you discuss any questions you have with your health care provider. Document Revised: 07/13/2018 Document Reviewed: 03/24/2016 Elsevier Patient Education  2020 [facility] Discharge After a Stroke  Being discharged from the hospital after a stroke can feel overwhelming. Many things may be different, and it is normal to feel scared or anxious. Some stroke survivors may be able to return to their homes, and others may need more specialized care on a temporary or permanent basis. Your stroke care team will work with you to develop a discharge plan that is best for you. Ask questions if you do not understand something. Invite a friend or family member to participate in discharge planning. Understanding and following your discharge plan can help to prevent another stroke or other  problems. Understanding your medicines After a stroke, your health care provider may prescribe one or more types of medicine. It is important to take medicines exactly as told by your health care provider. Serious harm, such as another stroke, can happen if you are unable to take your medicine exactly as prescribed. Make sure you understand:  What medicine to take.  Why you are taking the medicine.  How and when to take it.  If it can be taken with your other medicines and herbal supplements.  Possible side effects.  When to call your health care provider if you have any side effects.  How you will get and pay for your medicines. Medical assistance programs may be able to help you pay for prescription medicines if you cannot afford them. If you are taking an anticoagulant, be sure to take it exactly as told by your health care provider. This type of medicine can increase the risk of bleeding because it works to prevent blood from clotting. You may need to take certain precautions to prevent bleeding. You should contact your health care provider if you have:  Bleeding or bruising.  A fall or other injury to your head.  Blood in your urine or stool (feces). Planning for home safety  Take steps to prevent falls, such as installing grab bars or using a shower chair. Ask a friend or family member to get needed  things in place before you go home if possible. A therapist can come to your home to make recommendations for safety equipment. Ask your health care provider if you would benefit from this service or from home care. Getting needed equipment Ask your health care provider for a list of any medical equipment and supplies you will need at home. These may include items such as:  Walkers.  Canes.  Wheelchairs.  Hand-strengthening devices.  Special eating utensils. Medical equipment can be rented or purchased, depending on your insurance coverage. Check with your insurance company  about what is covered. Keeping follow-up visits After a stroke, you will need to follow up regularly with a health care provider. You may also need rehabilitation, which can include physical therapy, occupational therapy, or speech-language therapy. Keeping these appointments is very important to your recovery after a stroke. Be sure to bring your medicine list and discharge papers with you to your appointments. If you need help to keep track of your schedule, use a calendar or appointment reminder. Preventing another stroke Having a stroke puts you at risk for another stroke in the future. Ask your health care provider what actions you can take to lower the risk. These may include:  Increasing how much you exercise.  Making a healthy eating plan.  Quitting smoking.  Managing other health conditions, such as high blood pressure, high cholesterol, or diabetes.  Limiting alcohol use. Knowing the warning signs of a stroke  Make sure you understand the signs of a stroke. Before you leave the hospital, you will receive information outlining the stroke warning signs. Share these with your friends and family members. "BE FAST" is an easy way to remember the main warning signs of a stroke:  B - Balance. Signs are dizziness, sudden trouble walking, or loss of balance.  E - Eyes. Signs are trouble seeing or a sudden change in vision.  F - Face. Signs are sudden weakness or numbness of the face, or the face or eyelid drooping on one side.  A - Arms. Signs are weakness or numbness in an arm. This happens suddenly and usually on one side of the body.  S - Speech. Signs are sudden trouble speaking, slurred speech, or trouble understanding what people say.  T - Time. Time to call emergency services. Write down what time symptoms started. Other signs of stroke may include:  A sudden, severe headache with no known cause.  Nausea or vomiting.  Seizure. These symptoms may represent a serious  problem that is an emergency. Do not wait to see if the symptoms will go away. Get medical help right away. Call your local emergency services (911 in the [place] Do not drive yourself to the hospital. Make note of the time that you had your first symptoms. Your emergency responders or emergency room staff will need to know this information. Summary  Being discharged from the hospital after a stroke can feel overwhelming. It is normal to feel scared or anxious.  Make sure you take medicines exactly as told by your health care provider.  Know the warning signs of a stroke, and get help right way if you have any of these symptoms. "BE FAST" is an easy way to remember the main warning signs of a stroke. This information is not intended to replace advice given to you by your health care provider. Make sure you discuss any questions you have with your health care provider. Document Revised: 12/14/2018 Document Reviewed: 06/26/2016 Elsevier Patient Education  [place].   Physical Therapy After a Stroke After a stroke, some people experience physical changes or problems. Physical therapy may be prescribed to help you recover and overcome problems such as:  Inability to move (paralysis) or weakness, typically affecting one side of the body.  Trouble with balance.  Pain, a pins and needles sensation, or numbness in certain parts of the body. You may also have difficulty feeling touch, pressure, or changes in temperature.  Involuntary muscle tightening (spasticity).  Stiffness in muscles and joints.  Altered coordination and reflexes. What causes physical disability after a stroke? A stroke can damage parts of your brain that control your body's normal functions, including your ability to move and to keep your balance. The types of physical problems you have will depend on how severe the stroke was and where it was located in the brain. Weakness or paralysis may affect just your fingers  and hands, a whole leg or arm, or an entire side of your body. What is physical therapy? Physical therapy involves using exercises, stretches, and activities to help you regain movement and independence after your stroke. Physical therapy may focus on one or more of the following:  Range of motion. This can help with movement and reduce muscle stiffness.  Balance. This helps to lower your risk of falling.  Position changes or transfers, such as moving from sitting to standing or from a chair to a bed.  Coordination, such as getting an object from a shelf.  Muscle strength. Muscles may be strengthened with weights or by repeating certain motions.  Functional mobility. This may include stair training or learning how to use a wheelchair, walker, or cane.  Walking (gait training).  Activities of daily living, such as getting out of the car or buttoning a shirt. Why is physical therapy important? It is important to do exercises and follow your rehabilitation plan as told by your physical therapist. Physical therapy can:  Help you regain independence.  Prevent injury from falls by building strength and balance.  Lower your risk of blood clots.  Lower your risk of skin sores (pressure injuries).  Increase physical activity and exercise. This may help lower your risk for another stroke.  Help reduce pain. When will therapy start and where will I have therapy? Your health care provider will decide when it is best for you to start therapy. In some cases, people start rehabilitation, including physical therapy, as soon as they are medically stable, which may be 24-48 hours after a stroke. Rehabilitation can take place in a few different places, based on your needs. It may take place in:  The hospital or an in-patient rehabilitation hospital.  An outpatient rehabilitation facility.  A long-term care facility.  A community rehabilitation clinic.  Your home. What are assistive  devices? Assistive devices are tools to help you move, maintain balance, and manage daily tasks while recovering from a stroke. Your physical therapist may recommend and help you learn to use:  Equipment to help you move, such as wheelchairs, canes, or walkers.  Braces or splints to keep your arms, hands, legs, or feet in a comfortable and safe position.  Bathtub benches or grab bars to keep you safe in the bathroom.  Special utensils, bowls, and plates that allow you to eat with one hand. It is important to use these devices as told by your health care provider. Summary  After a stroke, some people may experience physical disabilities, such as weakness or paralysis, pain,  or balance problems.  Physical therapy involves exercises, stretches, and activities that help to improve your ability to move and to handle daily tasks.  Physical therapy exercises focus on restoring range of motion, balance, coordination, muscle strength, and the ability to move (mobility).  Physical therapy can help you regain independence, prevent falls, and allow you to live a more active lifestyle after a stroke. This information is not intended to replace advice given to you by your health care provider. Make sure you discuss any questions you have with your health care provider. Document Revised: 07/14/2018 Document Reviewed: 06/29/2016 Elsevier Patient Education  2020 [employer]   IMPORTANT INFORMATION: PAY CLOSE ATTENTION   PHYSICIAN DISCHARGE INSTRUCTIONS  Follow with Primary care provider  Patient, No Pcp Per  and other consultants as instructed by your Hospitalist Physician  SEEK MEDICAL CARE OR RETURN TO EMERGENCY ROOM IF SYMPTOMS COME BACK, WORSEN OR NEW PROBLEM DEVELOPS   Please note: You were cared for by a hospitalist during your hospital stay. Every effort will be made to forward records to your primary care provider.  You can request that your primary care provider send for your hospital  records if they have not received them.  Once you are discharged, your primary care physician will handle any further medical issues. Please note that NO REFILLS for any discharge medications will be authorized once you are discharged, as it is imperative that you return to your primary care physician (or establish a relationship with a primary care physician if you do not have one) for your post hospital discharge needs so that they can reassess your need for medications and monitor your lab values.  Please get a complete blood count and chemistry panel checked by your Primary MD at your next visit, and again as instructed by your Primary MD.  Get Medicines reviewed and adjusted: Please take all your medications with you for your next visit with your Primary MD  Laboratory/radiological data: Please request your Primary MD to go over all hospital tests and procedure/radiological results at the follow up, please ask your primary care provider to get all Hospital records sent to his/her office.  In some cases, they will be blood work, cultures and biopsy results pending at the time of your discharge. Please request that your primary care provider follow up on these results.  If you are diabetic, please bring your blood sugar readings with you to your follow up appointment with primary care.    Please call and make your follow up appointments as soon as possible.    Also Note the following: If you experience worsening of your admission symptoms, develop shortness of breath, life threatening emergency, suicidal or homicidal thoughts you must seek medical attention immediately by calling 911 or calling your MD immediately  if symptoms less severe.  You must read complete instructions/literature along with all the possible adverse reactions/side effects for all the Medicines you take and that have been prescribed to you. Take any new Medicines after you have completely understood and accpet all the  possible adverse reactions/side effects.   Do not drive when taking Pain medications or sleeping medications (Benzodiazepines)  Do not take more than prescribed Pain, Sleep and Anxiety Medications. It is not advisable to combine anxiety,sleep and pain medications without talking with your primary care practitioner  Special Instructions: If you have smoked or chewed Tobacco  in the last 2 yrs please stop smoking, stop any regular Alcohol  and or any Recreational drug use.  Wear Seat belts while driving.  Do not drive if taking any narcotic, mind altering or controlled substances or recreational drugs or alcohol.

## 2019-12-22 NOTE — Progress Notes (Signed)
Physical Therapy Treatment Patient Details Name: Alec Watson MRN: [id number] DOB: 1955-10-06 Today's Date: 12/22/2019    History of Present Illness Alec Watson is a 65 y.o. male with medical history significant for hypertension who presents to the emergency department via EMS. Patient was unable to provide history due to speech difficulty. History was obtained from ED physician and from ED medical record. Per report, family noted patient to be confused and unsteady on his feet, so EMS was activated, last known well time was possibly in the morning, he had falling in the yard and was presumed to be laying there for about an hour prior to being noted by family.  He was said to complain of generalized weakness.    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  Patient tolerated sitting up in chair after therapy.  Patient will benefit from continued physical therapy in hospital and recommended venue below to increase strength, balance, endurance for safe ADLs and gait.    Follow Up Recommendations  SNF     Equipment Recommendations  Rolling walker with 5" wheels    Recommendations for Other Services       Precautions / Restrictions Precautions Precautions: Fall Restrictions Weight Bearing Restrictions: No    Mobility  Bed Mobility Overal bed mobility: Needs Assistance Bed Mobility: Supine to Sit     Supine to sit: Supervision     General bed mobility comments: slightly labored movement with increased time  Transfers Overall transfer level: Needs assistance Equipment used: Rolling walker (2 wheeled) Transfers: Sit to/from [employer] Sit to Stand: Min assist;Mod  assist Stand pivot transfers: Min assist;Mod assist       General transfer comment: has diffiuclty completing sit to stands due to right sided weakness  Ambulation/Gait Ambulation/Gait assistance: Min assist;Mod assist Gait Distance (Feet): 35 Feet Assistive device: Rolling walker (2 wheeled) Gait Pattern/deviations: Decreased step length - right;Decreased stance time - right;Decreased stride length;Decreased dorsiflexion - right;Narrow base of support Gait velocity: decreased   General Gait Details: slow labored cadence with fair return for right heel to toe stepping due to tight heelcords, limited mostly to due to fatigue and had to take sitting rest break before ambulating back to room, followed with w/c for safety   Stairs             Wheelchair Mobility    Modified Rankin (Stroke Patients Only)       Balance Overall balance assessment: Needs assistance Sitting-balance support: Feet supported;No upper extremity supported Sitting balance-Leahy Scale: Good Sitting balance - Comments: seated at EOB   Standing balance support: During functional activity;Bilateral upper extremity supported Standing balance-Leahy Scale: Poor Standing balance comment: fair/poor using RW                            Cognition Arousal/Alertness: Awake/alert Behavior During Therapy: WFL for tasks assessed/performed Overall Cognitive Status: Within Functional Limits for tasks assessed                                        Exercises General Exercises - Lower  Extremity Long Arc Quad: Seated;AROM;Strengthening;Both;10 reps Hip Flexion/Marching: Seated;AROM;Strengthening;Both;10 reps Toe Raises: Seated;AROM;Strengthening;Both;10 reps Heel Raises: Seated;AROM;Strengthening;Both;10 reps    General Comments        Pertinent Vitals/Pain Pain Assessment: No/denies pain    Home Living                      Prior Function            PT Goals  (current goals can now be found in the care plan section) Acute Rehab PT Goals Patient Stated Goal: to go home PT Goal Formulation: With patient/family Time For Goal Achievement: 01/01/20 Potential to Achieve Goals: Good Progress towards PT goals: Progressing toward goals    Frequency    Min 6X/week      PT Plan Current plan remains appropriate    Co-evaluation              AM-PAC PT "6 Clicks" Mobility   Outcome Measure  Help needed turning from your back to your side while in a flat bed without using bedrails?: None Help needed moving from lying on your back to sitting on the side of a flat bed without using bedrails?: A Little Help needed moving to and from a bed to a chair (including a wheelchair)?: A Lot Help needed standing up from a chair using your arms (e.g., wheelchair or bedside chair)?: A Lot Help needed to walk in hospital room?: A Lot Help needed climbing 3-5 steps with a railing? : A Lot 6 Click Score: 15    End of Session Equipment Utilized During Treatment: Gait belt Activity Tolerance: Patient tolerated treatment well;Patient limited by fatigue Patient left: in chair;with call bell/phone within reach;with chair alarm set Nurse Communication: Mobility status PT Visit Diagnosis: Unsteadiness on feet (R26.81);Other abnormalities of gait and mobility (R26.89);Muscle weakness (generalized) (M62.81)     Time: 0071-2197 PT Time Calculation (min) (ACUTE ONLY): 30 min  Charges:  $Gait Training: 8-22 mins $Therapeutic Exercise: 8-22 mins                     1:57 PM, 12/22/19 [name], MPT Physical Therapist with [facility] [phone] office [phone] mobile phone

## 2019-12-22 NOTE — TOC Progression Note (Signed)
Transition of Care [facility]) - Progression Note    Patient Details  Name: Alec Watson MRN: [id number] Date of Birth: [birth year]  [facility]) CM/SW Contact  Elliot Gault, LCSW Phone Number: 12/22/2019, 3:39 PM  Clinical Narrative:     TOC following. Have spoken with Jill Side at [facility] several times today regarding pt's auth status. Per Jill Side, they have still not received auth. Apparently it can take up to three days. Per Jill Side, they can take pt over the weekend if they get auth.  Updated MD. Whitney Muse TOC will follow.   Expected Discharge Plan: Skilled Nursing Facility Barriers to Discharge: Insurance Authorization  Expected Discharge Plan and Services Expected Discharge Plan: Skilled Nursing Facility In-house Referral: Clinical Social Work     Living arrangements for the past 2 months: Single Family Home                                       Social Determinants of Health (SDOH) Interventions    Readmission Risk Interventions No flowsheet data found.

## 2019-12-23 NOTE — TOC Progression Note (Signed)
Transition of Care [facility]) - Progression Note   Patient Details  Name: Alec Watson MRN: [id number] Date of Birth: February 14, 1956  [facility]) CM/SW Contact  Ewing Schlein, LCSW Phone Number: 12/23/2019, 2:22 PM  Clinical Narrative: CSW updated by Revonda Standard with [facility] the facility has not received insurance authorization at this time. TOC to follow.  Expected Discharge Plan: Skilled Nursing Facility Barriers to Discharge: Insurance Authorization  Expected Discharge Plan and Services Expected Discharge Plan: Skilled Nursing Facility In-house Referral: Clinical Social Work Living arrangements for the past 2 months: Single Family Home  Readmission Risk Interventions No flowsheet data found.

## 2019-12-23 NOTE — Progress Notes (Signed)
12/23/2019 5:11 PM  Pt discharging to SNF when insurance authorization received.  He remains stable to discharge now.   Please see discharge summary.   Standley Dakins MD How to contact the [facility] Attending or Consulting provider 7A - 7P or covering provider during after hours 7P -7A, for this patient?  1. Check the care team in [facility] and look for a) attending/consulting TRH provider listed and b) the [facility] team listed 2. Log into [email or web address] and use [place]'s universal password to access. If you do not have the password, please contact the hospital operator. 3. Locate the [facility] provider you are looking for under [facility] and page to a number that you can be directly reached. 4. If you still have difficulty reaching the provider, please page the [facility] (Director on Call) for the Hospitalists listed on amion for assistance.

## 2019-12-23 NOTE — Plan of Care (Signed)
  Problem: Education: Goal: Knowledge of General Education information will improve Description: Including pain rating scale, medication(s)/side effects and non-pharmacologic comfort measures Outcome: Progressing   Problem: Health Behavior/Discharge Planning: Goal: Ability to manage health-related needs will improve Outcome: Progressing   Problem: Clinical Measurements: Goal: Ability to maintain clinical measurements within normal limits will improve Outcome: Progressing Goal: Will remain free from infection Outcome: Progressing   Problem: Education: Goal: Knowledge of secondary prevention will improve Outcome: Progressing

## 2019-12-24 NOTE — Progress Notes (Signed)
12/24/2019 11:23 AM  Pt discharging to SNF when insurance authorization received.  He remains stable to discharge now.   Please see discharge summary.  TOC informs me again that authorization has not been received.   Standley Dakins MD How to contact the [facility] Attending or Consulting provider 7A - 7P or covering provider during after hours 7P -7A, for this patient?  1. Check the care team in [facility] and look for a) attending/consulting TRH provider listed and b) the [facility] team listed 2. Log into [email or web address] and use [place]'s universal password to access. If you do not have the password, please contact the hospital operator. 3. Locate the [facility] provider you are looking for under [facility] and page to a number that you can be directly reached. 4. If you still have difficulty reaching the provider, please page the [facility] (Director on Call) for the Hospitalists listed on amion for assistance.

## 2019-12-24 NOTE — Plan of Care (Signed)
  Problem: Education: Goal: Knowledge of General Education information will improve Description: Including pain rating scale, medication(s)/side effects and non-pharmacologic comfort measures Outcome: Progressing   Problem: Health Behavior/Discharge Planning: Goal: Ability to manage health-related needs will improve Outcome: Progressing   Problem: Education: Goal: Knowledge of secondary prevention will improve Outcome: Progressing   

## 2019-12-25 LAB — SARS CORONAVIRUS 2 BY RT PCR (HOSPITAL ORDER, PERFORMED IN ~~LOC~~ HOSPITAL LAB): SARS Coronavirus 2: NEGATIVE

## 2019-12-25 NOTE — Progress Notes (Signed)
12/25/2019. 10:49 AM  Pt remains stable to discharge to SNF.  Auth received today. DC to SNF.  DC Summary updated.   Maryln Manuel MD

## 2019-12-25 NOTE — Plan of Care (Signed)
COVID negative, report called to [facility]. Patient notified of transfer.EMS called.

## 2019-12-25 NOTE — TOC Transition Note (Signed)
Transition of Care [facility]) - CM/SW Discharge Note   Patient Details  Name: CHRISTOHER DRUDGE MRN: [id number] Date of Birth: Apr 15, 1955  [facility] of [facility]) CM/SW Contact:  Barry Brunner, LCSW Phone Number: 12/25/2019, 11:53 AM   Clinical Narrative:    CSW contacted [facility] to follow up on ins auth for patient. Revonda Standard with [facility] reported that they had received auth. CSW notified patient's sister of patient's readiness for discharge. CSW notified MD of needed updated d/c summary and Covid test. [facility] agreeable to take patient with updated D/C summary and negative covid test. CSW completed med necessity form and called EMS. Nurse to call report.    Final next level of care: Skilled Nursing Facility Barriers to Discharge: Barriers Resolved   Patient Goals and CMS Choice Patient states their goals for this hospitalization and ongoing recovery are:: SNF CMS Medicare.gov Compare Post Acute Care list provided to:: Patient Choice offered to / list presented to : Patient  Discharge Placement   Existing PASRR number confirmed : 12/25/19          Patient chooses bed at: [facility] Patient to be transferred to facility by: [facility] Name of family member notified: Genice Rouge Patient and family notified of of transfer: 12/25/19  Discharge Plan and Services In-house Referral: Clinical Social Work                                   Social Determinants of Health (SDOH) Interventions     Readmission Risk Interventions No flowsheet data found.

## 2020-05-13 IMAGING — DX DG CHEST 2V
2 series · 2 of 2 positions shown · non-contrast
Comparison: June 25, 2019

CLINICAL DATA: Pain and weakness.

EXAM:
CHEST - 2 VIEW

[chest lat]
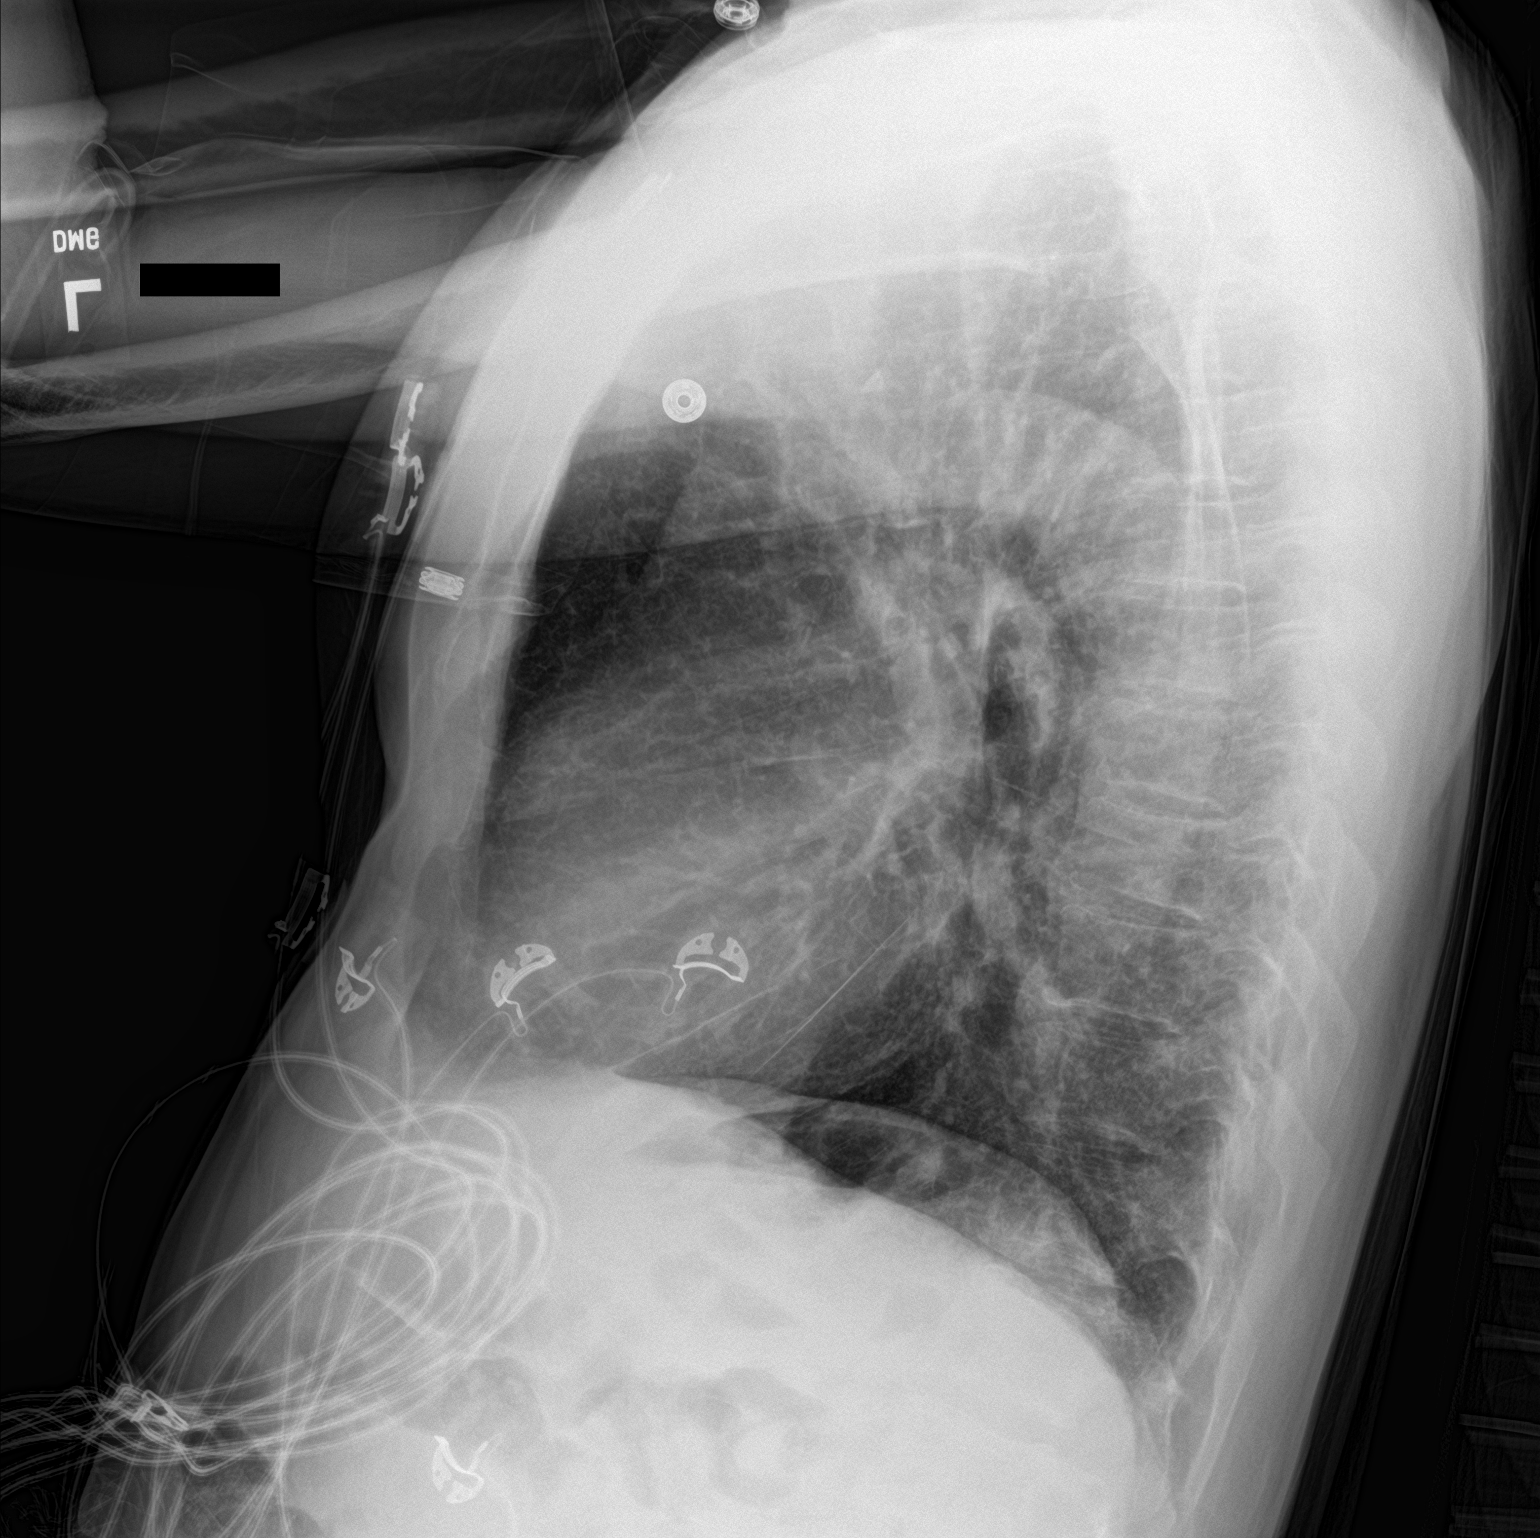

[chest ap]
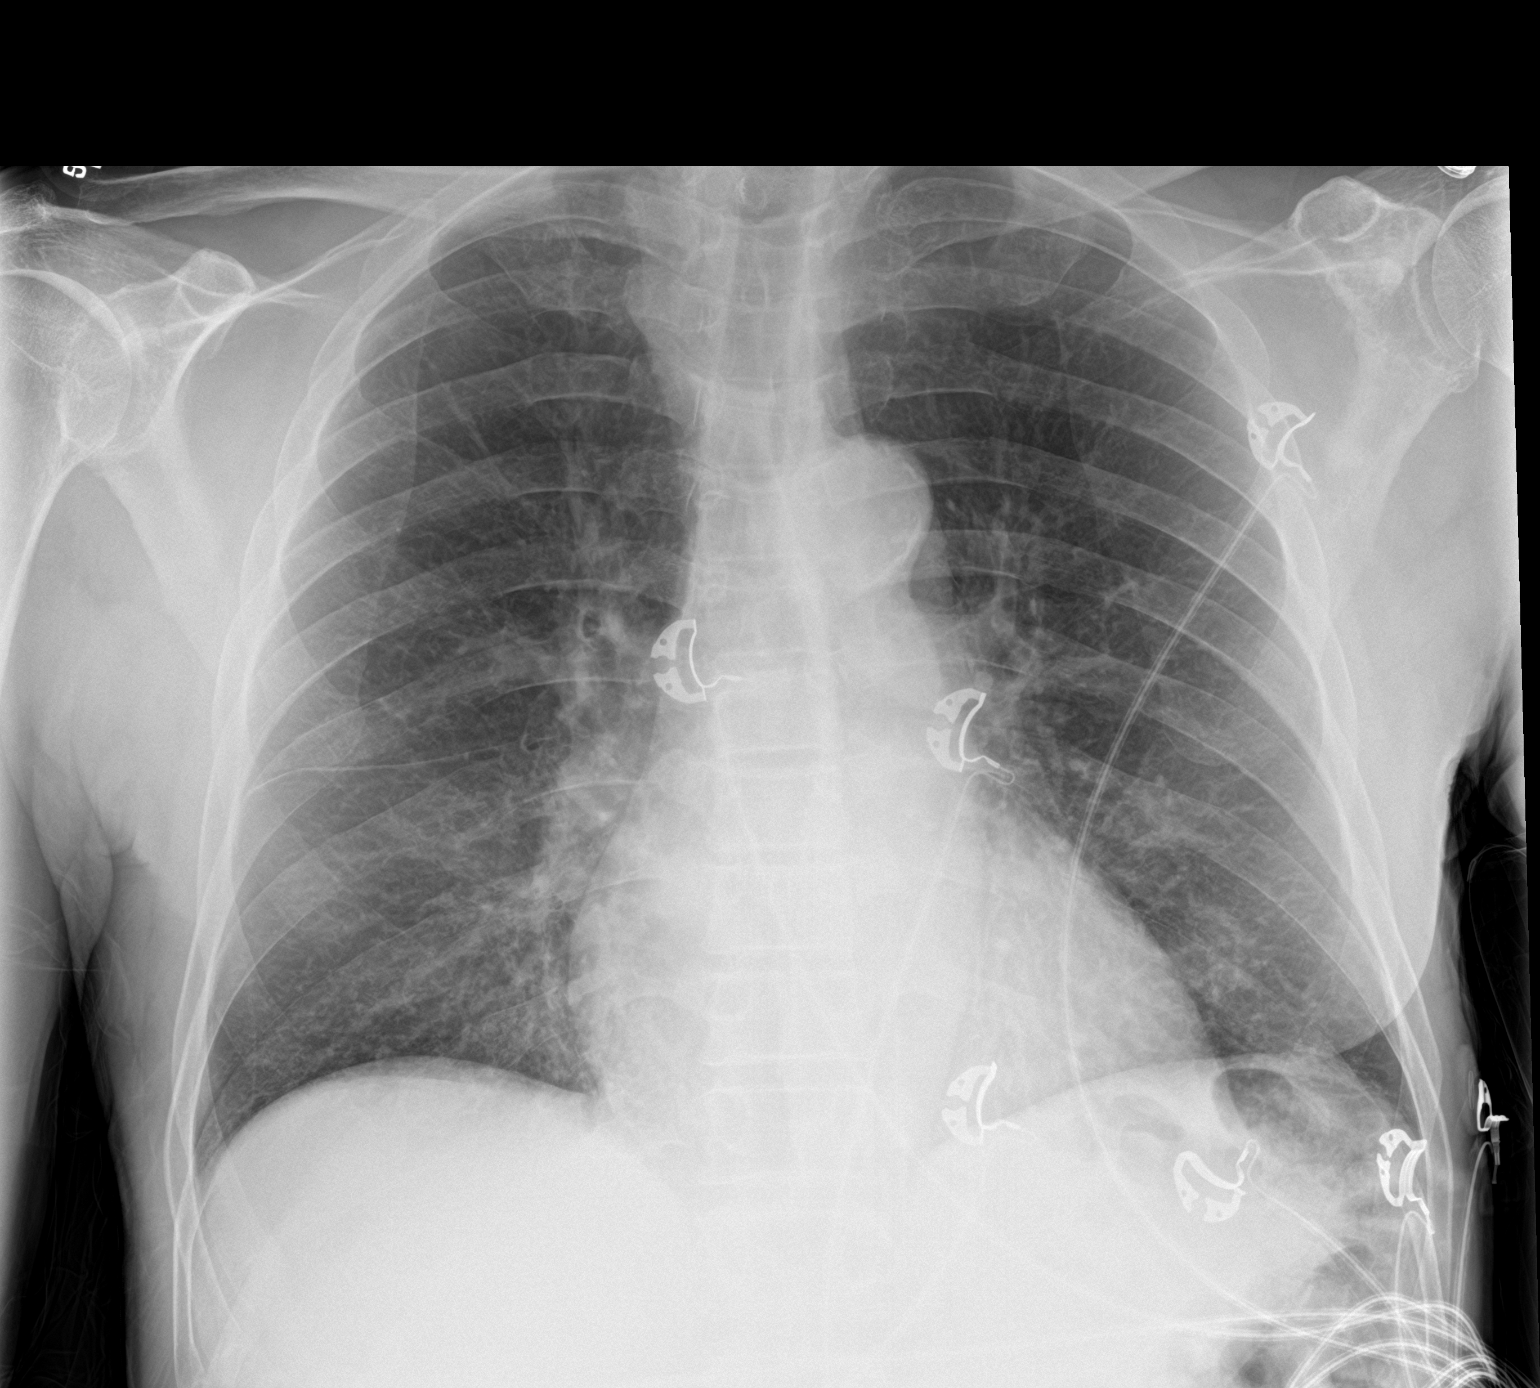

[2 of 2 positions shown; findings below may reference images not displayed]

FINDINGS: Heart size is mildly enlarged. Aortic calcifications are noted.
There is no pneumothorax. No large pleural effusion. No evidence for
a focal infiltrate. No acute osseous abnormality.
IMPRESSION: No active cardiopulmonary disease.

## 2020-06-23 IMAGING — MR MR LUMBAR SPINE WO/W CM
4 of 7 series · 13 of 48 positions shown · IV contrast (gadavist)
Comparison: None.

CLINICAL DATA: Lower back pain, question of cauda equina syndrome

EXAM:
MRI LUMBAR SPINE WITHOUT AND WITH CONTRAST
TECHNIQUE: Multiplanar and multiecho pulse sequences of the lumbar spine were
obtained without and with intravenous contrast.
CONTRAST:  7mL GADAVIST GADOBUTROL 1 MMOL/ML IV SOLN

[Series 3: T2 · sagittal · 4.0mm · 0.42mm/px · 3 of 15 slices shown (1 of 3)]
[im 1/15]
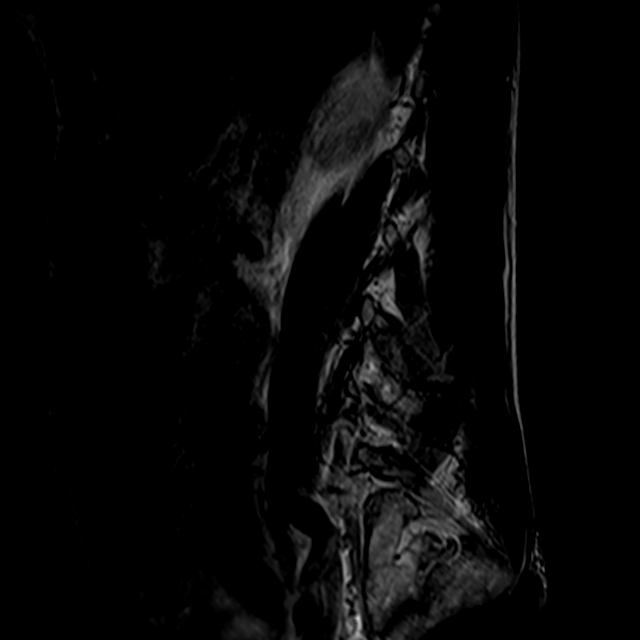
[im 8/15]
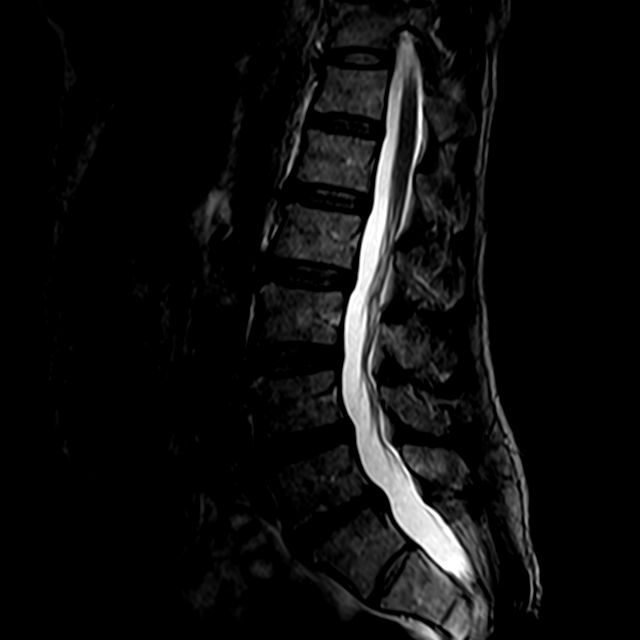
[im 15/15]
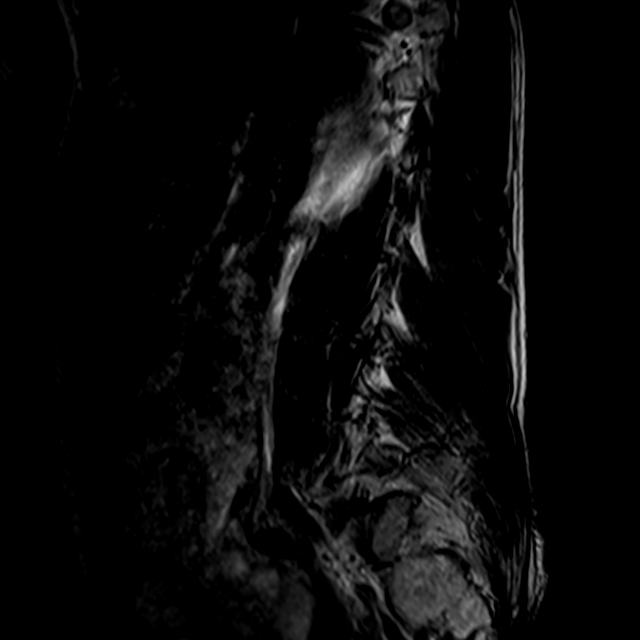

[Series 4: T1 · sagittal · 4.0mm · 0.42mm/px · 3 of 15 slices shown]
[im 1/15]
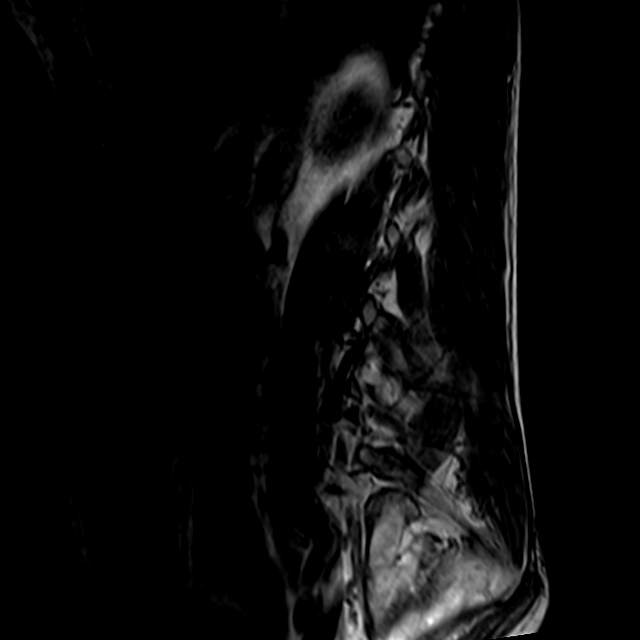
[im 10/15]
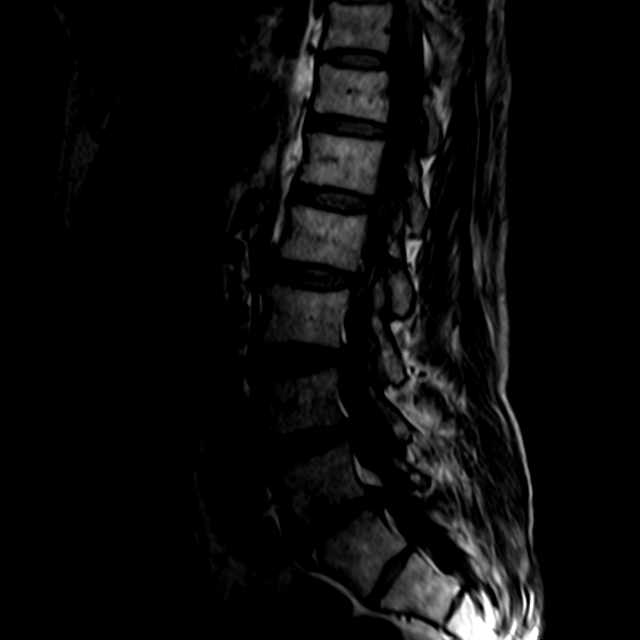
[im 15/15]
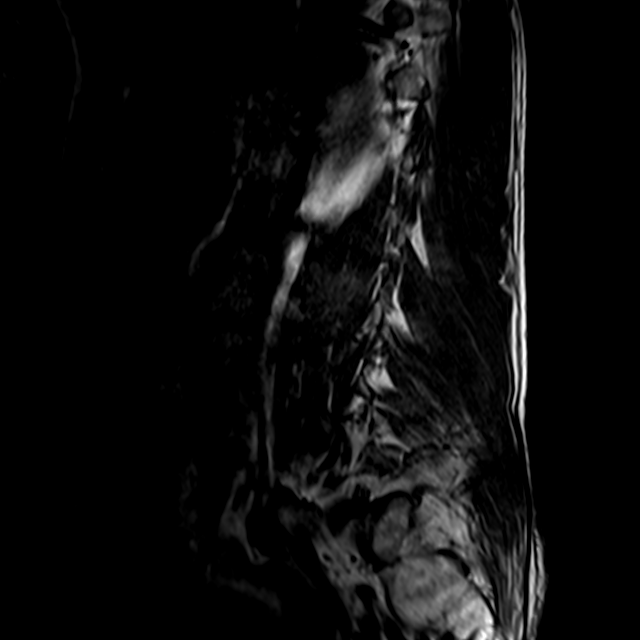

[Series 5: T2 · sagittal · 4.0mm · 0.42mm/px · 4 of 18 slices shown (2 of 3)]
[im 1/18]
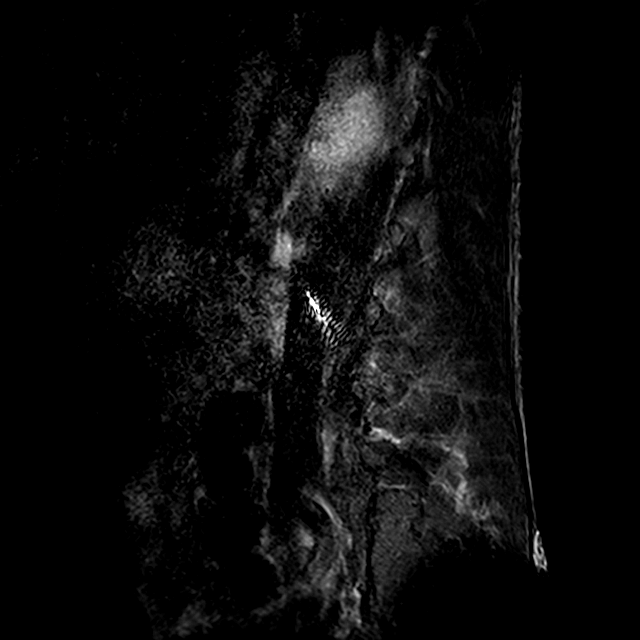
[im 6/18]
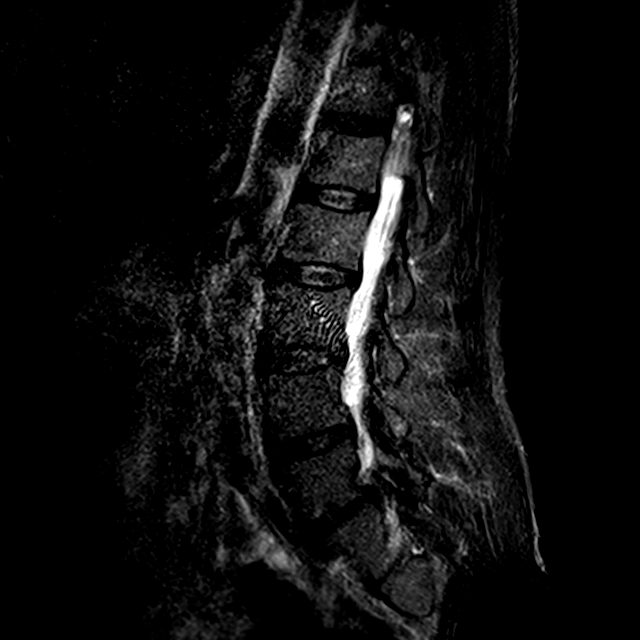
[im 12/18]
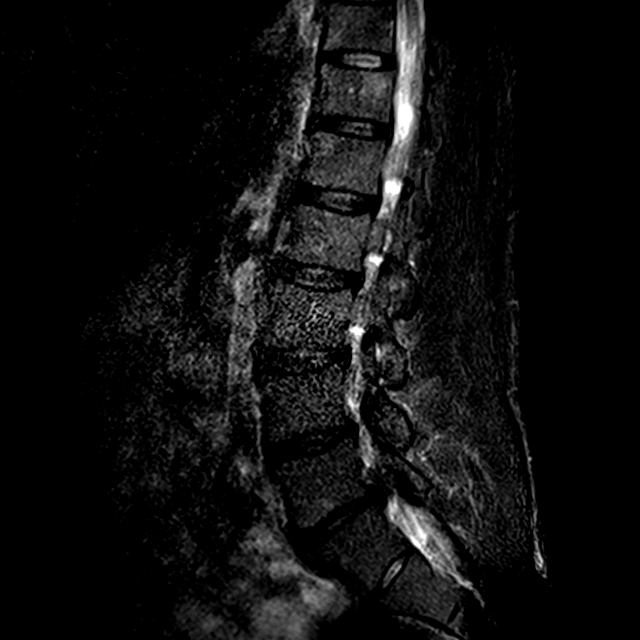
[im 18/18]
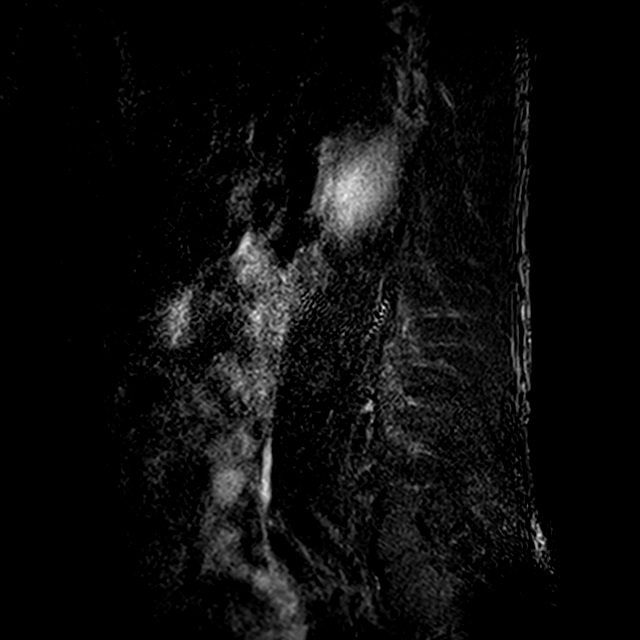

[Series 7: T2 · axial · 4.0mm · 0.24mm/px · z∈[-151,+9]mm · 3 of 43 slices shown (3 of 3)]
[im 5/43]
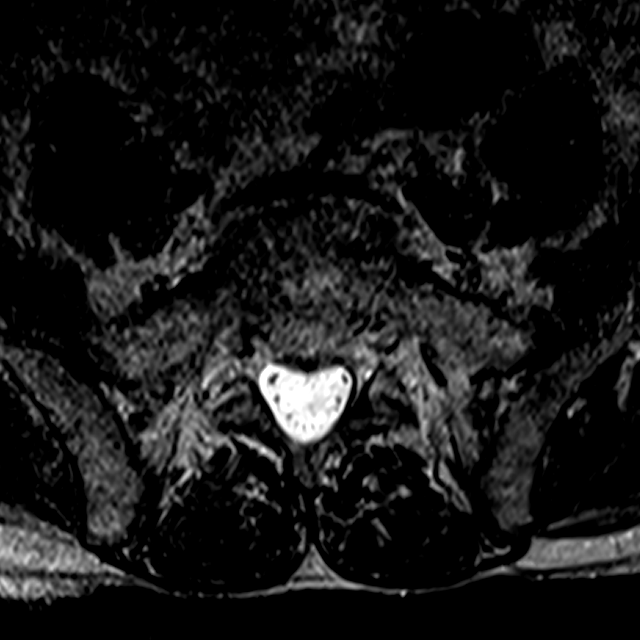
[im 22/43]
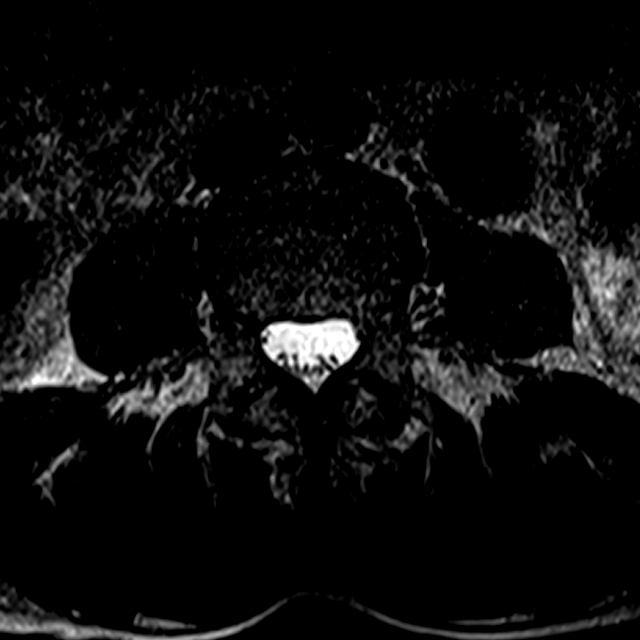
[im 38/43]
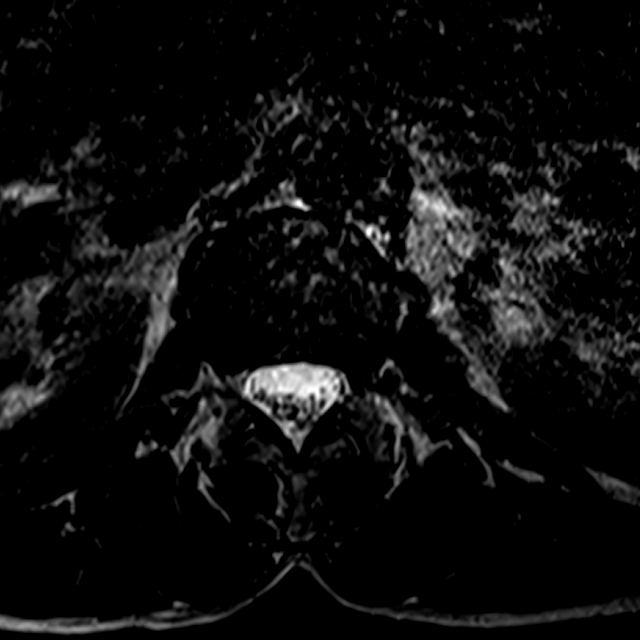

[13 of 48 positions shown; findings below may reference images not displayed]

FINDINGS: Segmentation: There are 5 non-rib bearing lumbar type vertebral
bodies with the last intervertebral disc space labeled as L5-S1.

Alignment:  Normal

Vertebrae: The vertebral body heights are well maintained. No
fracture, marrow edema,or pathologic marrow infiltration.

Conus medullaris and cauda equina: Conus extends to the L1-L2 level.
Conus and cauda equina appear normal. No areas of abnormal
intramedullary or leptomeningeal enhancement is seen.

Paraspinal and other soft tissues: The paraspinal soft tissues and
visualized retroperitoneal structures are unremarkable. The
sacroiliac joints are intact.

Disc levels:

T12-L1:  No significant canal or neural foraminal narrowing.

L1-L2:   No significant canal or neural foraminal narrowing.

L2-L3: There is a minimal broad-based disc bulge, however no
significant canal or neural foraminal narrowing.

L3-L4: There is a minimal broad-based disc bulge with ligamentum
flavum hypertrophy which causes mild effacement anterior thecal sac.

L4-L5: There is a broad-based disc bulge, facet arthrosis and
ligamentum flavum hypertrophy which causes mild bilateral neural
foraminal narrowing and mild effacement anterior thecal sac.

L5-S1: There is a broad-based disc bulge with a right lateral recess
disc protrusion which contacts the descending right S1 nerve root.
Facet arthrosis and ligamentum flavum hypertrophy are present. There
is moderate bilateral neural foraminal narrowing and mild effacement
anterior thecal sac.
IMPRESSION: Lumbar spine spondylosis most notable at L5-S1 with a right lateral
recess disc protrusion which contacts the descending right S1 nerve
root. There is moderate bilateral neural foraminal narrowing and
mild effacement anterior thecal sac.

## 2022-08-07 DIAGNOSIS — E039 Hypothyroidism, unspecified: Secondary | ICD-10-CM | POA: Diagnosis not present

## 2022-08-07 DIAGNOSIS — E119 Type 2 diabetes mellitus without complications: Secondary | ICD-10-CM | POA: Diagnosis not present

## 2022-08-07 DIAGNOSIS — E559 Vitamin D deficiency, unspecified: Secondary | ICD-10-CM | POA: Diagnosis not present

## 2022-08-07 DIAGNOSIS — I10 Essential (primary) hypertension: Secondary | ICD-10-CM | POA: Diagnosis not present

## 2022-08-07 DIAGNOSIS — D519 Vitamin B12 deficiency anemia, unspecified: Secondary | ICD-10-CM | POA: Diagnosis not present

## 2022-09-03 DIAGNOSIS — K5900 Constipation, unspecified: Secondary | ICD-10-CM | POA: Diagnosis not present

## 2022-09-03 DIAGNOSIS — I6990 Unspecified sequelae of unspecified cerebrovascular disease: Secondary | ICD-10-CM | POA: Diagnosis not present

## 2022-09-03 DIAGNOSIS — D509 Iron deficiency anemia, unspecified: Secondary | ICD-10-CM | POA: Diagnosis not present

## 2022-09-03 DIAGNOSIS — I10 Essential (primary) hypertension: Secondary | ICD-10-CM | POA: Diagnosis not present

## 2022-09-03 DIAGNOSIS — F5101 Primary insomnia: Secondary | ICD-10-CM | POA: Diagnosis not present

## 2022-09-03 DIAGNOSIS — E7800 Pure hypercholesterolemia, unspecified: Secondary | ICD-10-CM | POA: Diagnosis not present

## 2022-09-24 DIAGNOSIS — F5101 Primary insomnia: Secondary | ICD-10-CM | POA: Diagnosis not present

## 2022-10-01 DIAGNOSIS — D509 Iron deficiency anemia, unspecified: Secondary | ICD-10-CM | POA: Diagnosis not present

## 2022-10-01 DIAGNOSIS — E7800 Pure hypercholesterolemia, unspecified: Secondary | ICD-10-CM | POA: Diagnosis not present

## 2022-10-01 DIAGNOSIS — I10 Essential (primary) hypertension: Secondary | ICD-10-CM | POA: Diagnosis not present

## 2022-10-01 DIAGNOSIS — K5900 Constipation, unspecified: Secondary | ICD-10-CM | POA: Diagnosis not present

## 2022-10-05 DIAGNOSIS — F5101 Primary insomnia: Secondary | ICD-10-CM | POA: Diagnosis not present

## 2022-11-05 DIAGNOSIS — I10 Essential (primary) hypertension: Secondary | ICD-10-CM | POA: Diagnosis not present

## 2022-11-05 DIAGNOSIS — K5900 Constipation, unspecified: Secondary | ICD-10-CM | POA: Diagnosis not present

## 2022-11-05 DIAGNOSIS — Z8673 Personal history of transient ischemic attack (TIA), and cerebral infarction without residual deficits: Secondary | ICD-10-CM | POA: Diagnosis not present

## 2022-12-03 DIAGNOSIS — I10 Essential (primary) hypertension: Secondary | ICD-10-CM | POA: Diagnosis not present

## 2022-12-03 DIAGNOSIS — E7800 Pure hypercholesterolemia, unspecified: Secondary | ICD-10-CM | POA: Diagnosis not present

## 2022-12-03 DIAGNOSIS — F5101 Primary insomnia: Secondary | ICD-10-CM | POA: Diagnosis not present

## 2022-12-04 DIAGNOSIS — I10 Essential (primary) hypertension: Secondary | ICD-10-CM | POA: Diagnosis not present

## 2022-12-04 DIAGNOSIS — E7800 Pure hypercholesterolemia, unspecified: Secondary | ICD-10-CM | POA: Diagnosis not present

## 2022-12-31 DIAGNOSIS — I10 Essential (primary) hypertension: Secondary | ICD-10-CM | POA: Diagnosis not present

## 2022-12-31 DIAGNOSIS — E7800 Pure hypercholesterolemia, unspecified: Secondary | ICD-10-CM | POA: Diagnosis not present

## 2022-12-31 DIAGNOSIS — K5900 Constipation, unspecified: Secondary | ICD-10-CM | POA: Diagnosis not present

## 2022-12-31 DIAGNOSIS — D509 Iron deficiency anemia, unspecified: Secondary | ICD-10-CM | POA: Diagnosis not present

## 2023-01-04 DIAGNOSIS — I10 Essential (primary) hypertension: Secondary | ICD-10-CM | POA: Diagnosis not present

## 2023-01-04 DIAGNOSIS — E7800 Pure hypercholesterolemia, unspecified: Secondary | ICD-10-CM | POA: Diagnosis not present

## 2023-01-05 DIAGNOSIS — E7800 Pure hypercholesterolemia, unspecified: Secondary | ICD-10-CM | POA: Diagnosis not present

## 2023-01-05 DIAGNOSIS — I10 Essential (primary) hypertension: Secondary | ICD-10-CM | POA: Diagnosis not present

## 2023-01-05 DIAGNOSIS — F5101 Primary insomnia: Secondary | ICD-10-CM | POA: Diagnosis not present

## 2023-01-05 DIAGNOSIS — K5900 Constipation, unspecified: Secondary | ICD-10-CM | POA: Diagnosis not present

## 2023-02-03 DIAGNOSIS — E7800 Pure hypercholesterolemia, unspecified: Secondary | ICD-10-CM | POA: Diagnosis not present

## 2023-02-03 DIAGNOSIS — I10 Essential (primary) hypertension: Secondary | ICD-10-CM | POA: Diagnosis not present

## 2023-03-03 DIAGNOSIS — E7800 Pure hypercholesterolemia, unspecified: Secondary | ICD-10-CM | POA: Diagnosis not present

## 2023-03-03 DIAGNOSIS — I10 Essential (primary) hypertension: Secondary | ICD-10-CM | POA: Diagnosis not present

## 2023-03-12 DIAGNOSIS — F5101 Primary insomnia: Secondary | ICD-10-CM | POA: Diagnosis not present

## 2023-03-12 DIAGNOSIS — I10 Essential (primary) hypertension: Secondary | ICD-10-CM | POA: Diagnosis not present

## 2023-04-06 DIAGNOSIS — E7800 Pure hypercholesterolemia, unspecified: Secondary | ICD-10-CM | POA: Diagnosis not present

## 2023-04-06 DIAGNOSIS — I10 Essential (primary) hypertension: Secondary | ICD-10-CM | POA: Diagnosis not present

## 2023-04-09 DIAGNOSIS — K5900 Constipation, unspecified: Secondary | ICD-10-CM | POA: Diagnosis not present

## 2023-04-09 DIAGNOSIS — I10 Essential (primary) hypertension: Secondary | ICD-10-CM | POA: Diagnosis not present

## 2023-04-27 DIAGNOSIS — I10 Essential (primary) hypertension: Secondary | ICD-10-CM | POA: Diagnosis not present

## 2023-04-27 DIAGNOSIS — K5900 Constipation, unspecified: Secondary | ICD-10-CM | POA: Diagnosis not present

## 2023-04-28 DIAGNOSIS — E785 Hyperlipidemia, unspecified: Secondary | ICD-10-CM | POA: Diagnosis not present

## 2023-04-28 DIAGNOSIS — I700 Atherosclerosis of aorta: Secondary | ICD-10-CM | POA: Diagnosis not present

## 2023-05-21 DIAGNOSIS — I700 Atherosclerosis of aorta: Secondary | ICD-10-CM | POA: Diagnosis not present

## 2023-05-21 DIAGNOSIS — E785 Hyperlipidemia, unspecified: Secondary | ICD-10-CM | POA: Diagnosis not present

## 2023-06-16 DIAGNOSIS — I10 Essential (primary) hypertension: Secondary | ICD-10-CM | POA: Diagnosis not present

## 2023-07-08 DIAGNOSIS — I10 Essential (primary) hypertension: Secondary | ICD-10-CM | POA: Diagnosis not present

## 2023-07-08 DIAGNOSIS — K5900 Constipation, unspecified: Secondary | ICD-10-CM | POA: Diagnosis not present

## 2023-07-16 DIAGNOSIS — M6281 Muscle weakness (generalized): Secondary | ICD-10-CM | POA: Diagnosis not present

## 2023-07-16 DIAGNOSIS — E7800 Pure hypercholesterolemia, unspecified: Secondary | ICD-10-CM | POA: Diagnosis not present

## 2023-07-16 DIAGNOSIS — I10 Essential (primary) hypertension: Secondary | ICD-10-CM | POA: Diagnosis not present

## 2023-07-16 DIAGNOSIS — K5900 Constipation, unspecified: Secondary | ICD-10-CM | POA: Diagnosis not present

## 2023-07-23 DIAGNOSIS — D509 Iron deficiency anemia, unspecified: Secondary | ICD-10-CM | POA: Diagnosis not present

## 2023-07-23 DIAGNOSIS — E7800 Pure hypercholesterolemia, unspecified: Secondary | ICD-10-CM | POA: Diagnosis not present

## 2023-07-23 DIAGNOSIS — I10 Essential (primary) hypertension: Secondary | ICD-10-CM | POA: Diagnosis not present

## 2023-07-26 DIAGNOSIS — I10 Essential (primary) hypertension: Secondary | ICD-10-CM | POA: Diagnosis not present

## 2023-08-11 DIAGNOSIS — I10 Essential (primary) hypertension: Secondary | ICD-10-CM | POA: Diagnosis not present

## 2023-09-08 DIAGNOSIS — I10 Essential (primary) hypertension: Secondary | ICD-10-CM | POA: Diagnosis not present

## 2023-09-13 DIAGNOSIS — W501XXA Accidental kick by another person, initial encounter: Secondary | ICD-10-CM | POA: Diagnosis not present

## 2023-09-13 DIAGNOSIS — M79646 Pain in unspecified finger(s): Secondary | ICD-10-CM | POA: Diagnosis not present

## 2023-09-13 DIAGNOSIS — S62624A Displaced fracture of medial phalanx of right ring finger, initial encounter for closed fracture: Secondary | ICD-10-CM | POA: Diagnosis not present

## 2023-09-13 DIAGNOSIS — M19041 Primary osteoarthritis, right hand: Secondary | ICD-10-CM | POA: Diagnosis not present

## 2023-10-15 DIAGNOSIS — E7800 Pure hypercholesterolemia, unspecified: Secondary | ICD-10-CM | POA: Diagnosis not present

## 2023-10-15 DIAGNOSIS — N182 Chronic kidney disease, stage 2 (mild): Secondary | ICD-10-CM | POA: Diagnosis not present

## 2023-10-15 DIAGNOSIS — F17210 Nicotine dependence, cigarettes, uncomplicated: Secondary | ICD-10-CM | POA: Diagnosis not present

## 2023-10-15 DIAGNOSIS — I10 Essential (primary) hypertension: Secondary | ICD-10-CM | POA: Diagnosis not present

## 2023-11-04 DIAGNOSIS — N182 Chronic kidney disease, stage 2 (mild): Secondary | ICD-10-CM | POA: Diagnosis not present

## 2023-12-02 DIAGNOSIS — I10 Essential (primary) hypertension: Secondary | ICD-10-CM | POA: Diagnosis not present

## 2023-12-02 DIAGNOSIS — N182 Chronic kidney disease, stage 2 (mild): Secondary | ICD-10-CM | POA: Diagnosis not present

## 2024-01-18 DIAGNOSIS — I10 Essential (primary) hypertension: Secondary | ICD-10-CM | POA: Diagnosis not present

## 2024-01-18 DIAGNOSIS — E785 Hyperlipidemia, unspecified: Secondary | ICD-10-CM | POA: Diagnosis not present

## 2024-01-18 DIAGNOSIS — K5900 Constipation, unspecified: Secondary | ICD-10-CM | POA: Diagnosis not present

## 2024-01-19 DIAGNOSIS — L600 Ingrowing nail: Secondary | ICD-10-CM | POA: Diagnosis not present

## 2024-01-19 DIAGNOSIS — L602 Onychogryphosis: Secondary | ICD-10-CM | POA: Diagnosis not present

## 2024-01-19 DIAGNOSIS — I739 Peripheral vascular disease, unspecified: Secondary | ICD-10-CM | POA: Diagnosis not present

## 2024-01-19 DIAGNOSIS — Q667 Congenital pes cavus, unspecified foot: Secondary | ICD-10-CM | POA: Diagnosis not present

## 2024-01-19 DIAGNOSIS — M79672 Pain in left foot: Secondary | ICD-10-CM | POA: Diagnosis not present

## 2024-01-19 DIAGNOSIS — M79671 Pain in right foot: Secondary | ICD-10-CM | POA: Diagnosis not present

## 2024-01-19 DIAGNOSIS — I8393 Asymptomatic varicose veins of bilateral lower extremities: Secondary | ICD-10-CM | POA: Diagnosis not present

## 2024-01-19 DIAGNOSIS — R531 Weakness: Secondary | ICD-10-CM | POA: Diagnosis not present

## 2024-01-19 DIAGNOSIS — B351 Tinea unguium: Secondary | ICD-10-CM | POA: Diagnosis not present

## 2024-01-19 DIAGNOSIS — F17200 Nicotine dependence, unspecified, uncomplicated: Secondary | ICD-10-CM | POA: Diagnosis not present

## 2024-01-28 DIAGNOSIS — I10 Essential (primary) hypertension: Secondary | ICD-10-CM | POA: Diagnosis not present

## 2024-01-28 DIAGNOSIS — E785 Hyperlipidemia, unspecified: Secondary | ICD-10-CM | POA: Diagnosis not present
